# Patient Record
Sex: Male | Born: 1985 | Race: White | Hispanic: No | Marital: Married | Smoking: Current every day smoker
Health system: Southern US, Community
[De-identification: ages and names within clinical notes are randomized; demographics above are authoritative.]

## PROBLEM LIST (undated history)

## (undated) DIAGNOSIS — B20 Human immunodeficiency virus [HIV] disease: Principal | ICD-10-CM

## (undated) HISTORY — DX: Human immunodeficiency virus (HIV) disease: B20

---

## 2015-04-10 ENCOUNTER — Telehealth: Payer: Self-pay

## 2015-04-10 NOTE — Telephone Encounter (Signed)
Left message on machine to call office for new intake appointment.   Laurell Josephsammy K Junah Yam ,RN

## 2015-04-14 ENCOUNTER — Ambulatory Visit (INDEPENDENT_AMBULATORY_CARE_PROVIDER_SITE_OTHER): Payer: Self-pay

## 2015-04-14 ENCOUNTER — Ambulatory Visit: Payer: Self-pay

## 2015-04-14 DIAGNOSIS — Z113 Encounter for screening for infections with a predominantly sexual mode of transmission: Secondary | ICD-10-CM

## 2015-04-14 DIAGNOSIS — Z21 Asymptomatic human immunodeficiency virus [HIV] infection status: Secondary | ICD-10-CM

## 2015-04-14 DIAGNOSIS — Z79899 Other long term (current) drug therapy: Secondary | ICD-10-CM

## 2015-04-14 DIAGNOSIS — B20 Human immunodeficiency virus [HIV] disease: Secondary | ICD-10-CM

## 2015-04-15 LAB — HEPATITIS C ANTIBODY: HCV Ab: NEGATIVE

## 2015-04-15 LAB — COMPLETE METABOLIC PANEL WITH GFR
ALBUMIN: 4 g/dL (ref 3.6–5.1)
ALK PHOS: 64 U/L (ref 40–115)
ALT: 13 U/L (ref 9–46)
AST: 18 U/L (ref 10–40)
BUN: 9 mg/dL (ref 7–25)
CALCIUM: 10.4 mg/dL — AB (ref 8.6–10.3)
CO2: 26 mmol/L (ref 20–31)
Chloride: 103 mmol/L (ref 98–110)
Creat: 0.79 mg/dL (ref 0.60–1.35)
Glucose, Bld: 82 mg/dL (ref 65–99)
POTASSIUM: 4.3 mmol/L (ref 3.5–5.3)
Sodium: 139 mmol/L (ref 135–146)
Total Bilirubin: 0.3 mg/dL (ref 0.2–1.2)
Total Protein: 8.1 g/dL (ref 6.1–8.1)

## 2015-04-15 LAB — HEPATITIS A ANTIBODY, TOTAL: Hep A Total Ab: NONREACTIVE

## 2015-04-15 LAB — LIPID PANEL
Cholesterol: 94 mg/dL — ABNORMAL LOW (ref 125–200)
HDL: 30 mg/dL — ABNORMAL LOW (ref >=40)
LDL CALC: 56 mg/dL (ref <130)
Total CHOL/HDL Ratio: 3.1 Ratio (ref <=5.0)
Triglycerides: 40 mg/dL (ref <150)
VLDL: 8 mg/dL (ref <30)

## 2015-04-15 LAB — RPR

## 2015-04-15 LAB — HEPATITIS B SURFACE ANTIGEN: Hepatitis B Surface Ag: NEGATIVE

## 2015-04-15 LAB — HEPATITIS B CORE ANTIBODY, TOTAL: HEP B C TOTAL AB: NONREACTIVE

## 2015-04-15 LAB — HEPATITIS B SURFACE ANTIBODY,QUALITATIVE: HEP B S AB: NEGATIVE

## 2015-04-15 NOTE — Progress Notes (Signed)
Maya received partner testing at [facility] earlier in the month and now has a confirmed HIV test.   He has a same sex male partner for two years who is HIV positive.  They have not been using condoms and Alveda ReasonsZackery was never on PrEP.  Alveda ReasonsZackery says he completed an in home HIV test 6 months ago and it was negative. For the last two months he has complaint of swollen neck glands and no other symptoms. .   I explained to patient the importance of using condoms especially with his new HIV diagnosis.     Hanz almost lost consciousness  with blood draw and the labs were not performed.  He was asked to eat lunch and return at  1:30 pm the same day.  He did not return.  I called and left message on his voicemail to return for labs as soon as possible.   Laurell Josephsammy K King, RN

## 2015-05-12 ENCOUNTER — Encounter: Payer: Self-pay | Admitting: Internal Medicine

## 2015-05-20 ENCOUNTER — Ambulatory Visit: Payer: Self-pay | Admitting: Internal Medicine

## 2015-05-20 ENCOUNTER — Other Ambulatory Visit (INDEPENDENT_AMBULATORY_CARE_PROVIDER_SITE_OTHER): Payer: Self-pay

## 2015-05-20 DIAGNOSIS — Z21 Asymptomatic human immunodeficiency virus [HIV] infection status: Secondary | ICD-10-CM

## 2015-05-20 DIAGNOSIS — B20 Human immunodeficiency virus [HIV] disease: Secondary | ICD-10-CM

## 2015-05-20 LAB — CBC WITH DIFFERENTIAL/PLATELET
BASOS PCT: 0 %
Basophils Absolute: 0 cells/uL (ref 0–200)
EOS PCT: 4 %
Eosinophils Absolute: 180 cells/uL (ref 15–500)
HEMATOCRIT: 39.9 % (ref 38.5–50.0)
HEMOGLOBIN: 13.6 g/dL (ref 13.2–17.1)
LYMPHS ABS: 2025 {cells}/uL (ref 850–3900)
Lymphocytes Relative: 45 %
MCH: 28.5 pg (ref 27.0–33.0)
MCHC: 34.1 g/dL (ref 32.0–36.0)
MCV: 83.5 fL (ref 80.0–100.0)
MONO ABS: 450 {cells}/uL (ref 200–950)
MPV: 10 fL (ref 7.5–12.5)
Monocytes Relative: 10 %
NEUTROS ABS: 1845 {cells}/uL (ref 1500–7800)
NEUTROS PCT: 41 %
Platelets: 173 10*3/uL (ref 140–400)
RBC: 4.78 MIL/uL (ref 4.20–5.80)
RDW: 14.2 % (ref 11.0–15.0)
WBC: 4.5 10*3/uL (ref 3.8–10.8)

## 2015-05-21 LAB — URINALYSIS
Bilirubin Urine: NEGATIVE
Glucose, UA: NEGATIVE
HGB URINE DIPSTICK: NEGATIVE
KETONES UR: NEGATIVE
Leukocytes, UA: NEGATIVE
NITRITE: NEGATIVE
PH: 6 (ref 5.0–8.0)
Protein, ur: NEGATIVE
SPECIFIC GRAVITY, URINE: 1.027 (ref 1.001–1.035)

## 2015-05-21 LAB — T-HELPER CELL (CD4) - (RCID CLINIC ONLY)
CD4 % Helper T Cell: 14 % — ABNORMAL LOW (ref 33–55)
CD4 T Cell Abs: 280 /uL — ABNORMAL LOW (ref 400–2700)

## 2015-05-21 LAB — URINE CYTOLOGY ANCILLARY ONLY
CHLAMYDIA, DNA PROBE: NEGATIVE
NEISSERIA GONORRHEA: NEGATIVE

## 2015-05-22 LAB — QUANTIFERON TB GOLD ASSAY (BLOOD)
INTERFERON GAMMA RELEASE ASSAY: NEGATIVE
QUANTIFERON TB AG MINUS NIL: 0.03 [IU]/mL
Quantiferon Nil Value: 0.04 IU/mL

## 2015-05-22 LAB — HIV-1 RNA ULTRAQUANT REFLEX TO GENTYP+
HIV 1 RNA Quant: 99442 copies/mL — ABNORMAL HIGH (ref <20)
HIV-1 RNA Quant, Log: 5 Log copies/mL — ABNORMAL HIGH (ref <1.30)

## 2015-05-22 LAB — HLA B*5701

## 2015-05-31 LAB — HIV-1 GENOTYPR PLUS

## 2015-06-09 ENCOUNTER — Encounter: Payer: Self-pay | Admitting: Infectious Disease

## 2015-06-09 ENCOUNTER — Ambulatory Visit (INDEPENDENT_AMBULATORY_CARE_PROVIDER_SITE_OTHER): Payer: Self-pay | Admitting: Infectious Disease

## 2015-06-09 ENCOUNTER — Ambulatory Visit: Payer: Self-pay | Admitting: *Deleted

## 2015-06-09 VITALS — BP 139/74 | HR 72 | Temp 97.5°F | Ht 71.0 in | Wt 135.0 lb

## 2015-06-09 DIAGNOSIS — Z23 Encounter for immunization: Secondary | ICD-10-CM

## 2015-06-09 DIAGNOSIS — B20 Human immunodeficiency virus [HIV] disease: Secondary | ICD-10-CM

## 2015-06-09 DIAGNOSIS — F411 Generalized anxiety disorder: Secondary | ICD-10-CM

## 2015-06-09 HISTORY — DX: Human immunodeficiency virus (HIV) disease: B20

## 2015-06-09 MED ORDER — EMTRICITABINE-TENOFOVIR AF 200-25 MG PO TABS: 1.0000 | ORAL_TABLET | Freq: Every day | ORAL | Status: DC

## 2015-06-09 MED ORDER — DOLUTEGRAVIR SODIUM 50 MG PO TABS: 50.0000 mg | ORAL_TABLET | Freq: Every day | ORAL | Status: DC

## 2015-06-09 NOTE — BH Specialist Note (Signed)
Counselor met with Eddie Bruce today in the exam room for a warm hand off.  Patient is new and just diagnosed with HIV.  Counselor made the necessary introductions.  Patient was oriented times four with flat affect but proper dress.  Patient was not very engaging and had a wall up while talking to counselor.  Patient indicated that he does not need any services right now as his partner was positive and he knew pretty much hope things were going to go from here on out. Counselor provided support and encouraged patient to make an appointment if he ever needed to talk or was having a difficult time in his life.   Rolena Infante, MA, LPC [facility]

## 2015-06-09 NOTE — Progress Notes (Signed)
Subjective:    Chief complaint: establish care as new patient with HIV   Patient ID: Eddie Bruce, male    DOB: 09-25-1985, 30 y.o.   MRN: [id number]  HPI  30 year old 24 man who is recently diagnosed with HIV infection. He had symptoms consistent with acute HIV with cervical lymphadenopathy malaise and fevers. His HIV viral load and testing here was 99,000 and CD4 count close to 300. And genotype showed wild type virus he does not have hepatitis B. HLA B5701 was not performed. Apparently he nearly passed out during the phlebotomy.  He feels he is coping fairly well with his diagnosis but is anxious start therapy ADAP will be initiated today and we'll also engage in [facility] tomorrow when one of our counselors who can perform [employer] is here.  After extensive discussion we discussed decided to start him on Tivicay and Descovy  Past Medical History  Diagnosis Date  . HIV disease ([place]) 06/09/2015    History reviewed. No pertinent past surgical history.  History reviewed. No pertinent family history.    Social History   Social History  . Marital Status: Single    Spouse Name: N/A  . Number of Children: N/A  . Years of Education: N/A   Social History Main Topics  . Smoking status: Current Every Day Smoker -- 0.25 packs/day for 15 years    Types: Cigarettes  . Smokeless tobacco: Never Used  . Alcohol Use: 0.0 oz/week    0 Standard drinks or equivalent per week     Comment: rarely  . Drug Use: No  . Sexual Activity:    Partners: Male    Patent examiner Protection: None     Comment: pt. declined condoms   Other Topics Concern  . None   Social History Narrative    No Known Allergies   Current outpatient prescriptions:  .  dolutegravir (TIVICAY) 50 MG tablet, Take 1 tablet (50 mg total) by mouth daily., Disp: 30 tablet, Rfl: 11 .  emtricitabine-tenofovir AF (DESCOVY) 200-25 MG tablet, Take 1 tablet by mouth daily., Disp: 30 tablet, Rfl:  11   Review of Systems  Constitutional: Negative for fever, chills, diaphoresis, activity change, appetite change, fatigue and unexpected weight change.  HENT: Negative for congestion, rhinorrhea, sinus pressure, sneezing, sore throat and trouble swallowing.   Eyes: Negative for photophobia and visual disturbance.  Respiratory: Negative for cough, chest tightness, shortness of breath, wheezing and stridor.   Cardiovascular: Negative for chest pain, palpitations and leg swelling.  Gastrointestinal: Negative for nausea, vomiting, abdominal pain, diarrhea, constipation, blood in stool, abdominal distention and anal bleeding.  Genitourinary: Negative for dysuria, hematuria, flank pain and difficulty urinating.  Musculoskeletal: Negative for myalgias, back pain, joint swelling, arthralgias and gait problem.  Skin: Negative for color change, pallor, rash and wound.  Neurological: Negative for dizziness, tremors, weakness and light-headedness.  Hematological: Negative for adenopathy. Does not bruise/bleed easily.  Psychiatric/Behavioral: Negative for behavioral problems, confusion, sleep disturbance, dysphoric mood, decreased concentration and agitation.       Objective:   Physical Exam  Constitutional: He is oriented to person, place, and time. He appears well-developed and well-nourished.  HENT:  Head: Normocephalic and atraumatic.  Eyes: Conjunctivae and EOM are normal.  Neck: Normal range of motion. Neck supple.  Cardiovascular: Normal rate and regular rhythm.   Pulmonary/Chest: Effort normal. No respiratory distress. He has no wheezes.  Abdominal: Soft. He exhibits no distension.  Musculoskeletal: Normal range of motion. He exhibits  no edema or tenderness.  Neurological: He is alert and oriented to person, place, and time.  Skin: Skin is warm and dry. No rash noted. No erythema. No pallor.  Psychiatric: He has a normal mood and affect. His behavior is normal. Judgment and thought  content normal.          Assessment & Plan:   HIV disease: enroll into [employer], ADAP start [place] and [place] rtc to see Pharmacy in 2 weeks and to get labs 4 weeks after starting meds and see me in 5 weeks post starting meds  Smoker: needs to quit and will counsel  I spent greater than 45  minutes with the patient including greater than 50% of time in face to face counsel of the patient Regarding the nature of HIV infection disease the nature of different antiretroviral regimens in the strengths and weaknesses of each of them and in coordination of his care.

## 2015-06-12 ENCOUNTER — Ambulatory Visit: Payer: Self-pay

## 2015-06-30 ENCOUNTER — Ambulatory Visit (INDEPENDENT_AMBULATORY_CARE_PROVIDER_SITE_OTHER): Payer: Self-pay | Admitting: Pharmacist

## 2015-06-30 DIAGNOSIS — B20 Human immunodeficiency virus [HIV] disease: Secondary | ICD-10-CM

## 2015-06-30 NOTE — Progress Notes (Signed)
HPI: Eddie EwingsZackery Bruce is a 30 y.o. male presenting for HIV management and followup. Started on Tivicay and Descovy 1 week ago but states is taking the medications in the AM but is having insomnia. He is not having any problems getting to sleep but is having difficulty staying asleep. Some days he wakes up around every hour but other times he wakes up 2-3x/night.   Denies missed doses. States he takes his medications first thing in the AM. Denies taking any OTC medications  Allergies: No Known Allergies  Vitals:    Past Medical History: Past Medical History  Diagnosis Date  . HIV disease ([facility]) 06/09/2015    Social History: Social History   Social History  . Marital Status: Single    Spouse Name: N/A  . Number of Children: N/A  . Years of Education: N/A   Social History Main Topics  . Smoking status: Current Every Day Smoker -- 0.25 packs/day for 15 years    Types: Cigarettes  . Smokeless tobacco: Never Used  . Alcohol Use: 0.0 oz/week    0 Standard drinks or equivalent per week     Comment: rarely  . Drug Use: No  . Sexual Activity:    Partners: Male    Pharmacist, hospitalBirth Control/ Protection: None     Comment: pt. declined condoms   Other Topics Concern  . Not on file   Social History Narrative    Previous Regimen: N/A  Current Regimen: Tivicay 50 mg daily Descovy 200-25 mg daily   Labs: HIV 1 RNA QUANT (copies/mL)  Date Value  05/20/2015 99442*   CD4 T CELL ABS (/uL)  Date Value  05/20/2015 280*   HEP B S AB (no units)  Date Value  04/14/2015 NEG   HEPATITIS B SURFACE AG (no units)  Date Value  04/14/2015 NEGATIVE   HCV AB (no units)  Date Value  04/14/2015 NEGATIVE    CrCl: CrCl cannot be calculated (Unknown ideal weight.).  Lipids:    Component Value Date/Time   CHOL 94* 04/14/2015 1622   TRIG 40 04/14/2015 1622   HDL 30* 04/14/2015 1622   CHOLHDL 3.1 04/14/2015 1622   VLDL 8 04/14/2015 1622   LDLCALC 56 04/14/2015 1622    Assessment: HIV:  Started on Tivicay/Descovy ~1 week ago and experiencing difficulty staying asleep since starting his ART. Not known side effects for either of these medications and likely due to other stressors from new HIV diagnosis and newly started HIV management.   Recommendations: Continue Tivicay and Descovy daily  Will try benadryl or Tylenol PM PRN for insomnia Instructed that if the insomnia is a side effect that it is likely to resolved after another week or 2 since recently started the medication. Followup HIV RNA, CD4, CBC, [id number]  Followup with Dr Daiva EvesVan Dam in July   Synetta FailKelsy E Azure Budnick, PharmD Pharmacy Resident [facility] 06/30/2015, 10:10 AM

## 2015-07-14 ENCOUNTER — Other Ambulatory Visit (INDEPENDENT_AMBULATORY_CARE_PROVIDER_SITE_OTHER): Payer: Self-pay

## 2015-07-14 ENCOUNTER — Ambulatory Visit: Payer: Self-pay

## 2015-07-14 DIAGNOSIS — B20 Human immunodeficiency virus [HIV] disease: Secondary | ICD-10-CM

## 2015-07-14 LAB — COMPLETE METABOLIC PANEL WITH GFR
ALT: 10 U/L (ref 9–46)
AST: 16 U/L (ref 10–40)
Albumin: 4.3 g/dL (ref 3.6–5.1)
Alkaline Phosphatase: 71 U/L (ref 40–115)
BUN: 15 mg/dL (ref 7–25)
CALCIUM: 10.4 mg/dL — AB (ref 8.6–10.3)
CHLORIDE: 102 mmol/L (ref 98–110)
CO2: 26 mmol/L (ref 20–31)
CREATININE: 1.15 mg/dL (ref 0.60–1.35)
GFR, Est Non African American: 85 mL/min (ref >=60)
Glucose, Bld: 76 mg/dL (ref 65–99)
POTASSIUM: 3.9 mmol/L (ref 3.5–5.3)
Sodium: 136 mmol/L (ref 135–146)
Total Bilirubin: 0.4 mg/dL (ref 0.2–1.2)
Total Protein: 8.4 g/dL — ABNORMAL HIGH (ref 6.1–8.1)

## 2015-07-14 LAB — CBC WITH DIFFERENTIAL/PLATELET
Basophils Absolute: 0 cells/uL (ref 0–200)
Basophils Relative: 0 %
EOS ABS: 153 {cells}/uL (ref 15–500)
Eosinophils Relative: 3 %
HEMATOCRIT: 42.2 % (ref 38.5–50.0)
Hemoglobin: 14.2 g/dL (ref 13.2–17.1)
LYMPHS PCT: 44 %
Lymphs Abs: 2244 cells/uL (ref 850–3900)
MCH: 28.3 pg (ref 27.0–33.0)
MCHC: 33.6 g/dL (ref 32.0–36.0)
MCV: 84.1 fL (ref 80.0–100.0)
MONO ABS: 357 {cells}/uL (ref 200–950)
MPV: 9.9 fL (ref 7.5–12.5)
Monocytes Relative: 7 %
NEUTROS PCT: 46 %
Neutro Abs: 2346 cells/uL (ref 1500–7800)
Platelets: 214 10*3/uL (ref 140–400)
RBC: 5.02 MIL/uL (ref 4.20–5.80)
RDW: 14.9 % (ref 11.0–15.0)
WBC: 5.1 10*3/uL (ref 3.8–10.8)

## 2015-07-15 LAB — HIV-1 RNA QUANT-NO REFLEX-BLD
HIV 1 RNA QUANT: 257 {copies}/mL — AB (ref <20)
HIV-1 RNA Quant, Log: 2.41 Log copies/mL — ABNORMAL HIGH (ref <1.30)

## 2015-07-15 LAB — T-HELPER CELL (CD4) - (RCID CLINIC ONLY)
CD4 % Helper T Cell: 16 % — ABNORMAL LOW (ref 33–55)
CD4 T Cell Abs: 360 /uL — ABNORMAL LOW (ref 400–2700)

## 2015-07-18 LAB — HLA B*5701: HLA-B 5701 W/RFLX HLA-B HIGH: NEGATIVE

## 2015-07-28 ENCOUNTER — Encounter: Payer: Self-pay | Admitting: Infectious Disease

## 2015-07-28 ENCOUNTER — Ambulatory Visit (INDEPENDENT_AMBULATORY_CARE_PROVIDER_SITE_OTHER): Payer: Self-pay | Admitting: Infectious Disease

## 2015-07-28 VITALS — BP 136/72 | HR 90 | Temp 97.3°F | Ht 71.0 in | Wt 139.5 lb

## 2015-07-28 DIAGNOSIS — B20 Human immunodeficiency virus [HIV] disease: Secondary | ICD-10-CM

## 2015-07-28 DIAGNOSIS — Z23 Encounter for immunization: Secondary | ICD-10-CM

## 2015-07-28 NOTE — Progress Notes (Signed)
Subjective:   Chief complaint: followup for recently diagnosed HIV infection    Patient ID: Eddie Bruce, male    DOB: 1985-05-21, 30 y.o.   MRN: [id number]  HPI   30 year old 19 man who is recently diagnosed with HIV infection. He had symptoms consistent with acute HIV with cervical lymphadenopathy malaise and fevers. His HIV viral load and testing here was 99,000 and CD4 count close to 300. And genotype showed wild type virus he does not have hepatitis B. HLA B5701 was not performed.   We enrolled him into [place] but he left town and could not start his ARV until late June and meds were Tivicay and Descovy. VL has come down from 99k to above 200 copies and he claims to be highly adherent to meds.   Past Medical History:  Diagnosis Date  . HIV disease ([place]) 06/09/2015    No past surgical history on file.  No family history on file.    Social History   Social History  . Marital status: Single    Spouse name: N/A  . Number of children: N/A  . Years of education: N/A   Social History Main Topics  . Smoking status: Current Every Day Smoker    Packs/day: 0.25    Years: 15.00    Types: Cigarettes  . Smokeless tobacco: Never Used  . Alcohol use 0.0 oz/week     Comment: rarely  . Drug use: No  . Sexual activity: Yes    Partners: Male    Birth control/ protection: 30     Comment: pt. declined condoms   Other Topics Concern  . None   Social History Narrative  . None    No Known Allergies   Current Outpatient Prescriptions:  .  dolutegravir (TIVICAY) 50 MG tablet, Take 1 tablet (50 mg total) by mouth daily., Disp: 30 tablet, Rfl: 11 .  emtricitabine-tenofovir AF (DESCOVY) 200-25 MG tablet, Take 1 tablet by mouth daily., Disp: 30 tablet, Rfl: 11   Review of Systems  Constitutional: Negative for activity change, appetite change, chills, diaphoresis, fatigue, fever and unexpected weight change.  HENT: Negative for congestion, rhinorrhea,  sinus pressure, sneezing, sore throat and trouble swallowing.   Eyes: Negative for photophobia and visual disturbance.  Respiratory: Negative for cough, chest tightness, shortness of breath, wheezing and stridor.   Cardiovascular: Negative for chest pain, palpitations and leg swelling.  Gastrointestinal: Negative for abdominal distention, abdominal pain, anal bleeding, blood in stool, constipation, diarrhea, nausea and vomiting.  Genitourinary: Negative for difficulty urinating, dysuria, flank pain and hematuria.  Musculoskeletal: Negative for arthralgias, back pain, gait problem, joint swelling and myalgias.  Skin: Negative for color change, pallor, rash and wound.  Neurological: Negative for dizziness, tremors, weakness and light-headedness.  Hematological: Negative for adenopathy. Does not bruise/bleed easily.  Psychiatric/Behavioral: Negative for agitation, behavioral problems, confusion, decreased concentration, dysphoric mood and sleep disturbance.       Objective:   Physical Exam  Constitutional: He is oriented to person, place, and time. He appears well-developed and well-nourished.  HENT:  Head: Normocephalic and atraumatic.  Eyes: Conjunctivae and EOM are normal.  Neck: Normal range of motion. Neck supple.  Cardiovascular: Normal rate and regular rhythm.   Pulmonary/Chest: Effort normal. No respiratory distress. He has no wheezes.  Abdominal: Soft. He exhibits no distension.  Musculoskeletal: Normal range of motion. He exhibits no edema or tenderness.  Neurological: He is alert and oriented to person, place, and time.  Skin: Skin  is warm and dry. No rash noted. No erythema. No pallor.  Psychiatric: He has a normal mood and affect. His behavior is normal. Judgment and thought content normal.          Assessment & Plan:   HIV disease: renew ADAP and recheck VL today and continue Tivicay and Descovy rtc to see me in 3 months  Smoker: needs to quit and will counsel

## 2015-08-01 LAB — HIV RNA, RTPCR W/R GT (RTI, PI,INT)
HIV 1 RNA Quant: 72 copies/mL — ABNORMAL HIGH
HIV-1 RNA QUANT, LOG: 1.86 {Log_copies}/mL — AB

## 2015-08-06 ENCOUNTER — Encounter: Payer: Self-pay | Admitting: Infectious Disease

## 2015-08-13 ENCOUNTER — Other Ambulatory Visit: Payer: Self-pay | Admitting: *Deleted

## 2015-08-13 MED ORDER — EMTRICITABINE-TENOFOVIR AF 200-25 MG PO TABS: 1.0000 | ORAL_TABLET | Freq: Every day | ORAL | 5 refills | Status: DC

## 2015-08-13 MED ORDER — DOLUTEGRAVIR SODIUM 50 MG PO TABS: 50.0000 mg | ORAL_TABLET | Freq: Every day | ORAL | 5 refills | Status: DC

## 2015-10-27 ENCOUNTER — Ambulatory Visit: Payer: Self-pay | Admitting: Infectious Disease

## 2015-11-04 ENCOUNTER — Ambulatory Visit: Payer: Self-pay | Admitting: Infectious Disease

## 2016-01-07 ENCOUNTER — Other Ambulatory Visit (HOSPITAL_COMMUNITY)
Admission: RE | Admit: 2016-01-07 | Discharge: 2016-01-07 | Disposition: A | Discharge status: Home / Self Care | Payer: BLUE CROSS/BLUE SHIELD | Source: Ambulatory Visit | Attending: Infectious Disease | Admitting: Infectious Disease

## 2016-01-07 ENCOUNTER — Ambulatory Visit (INDEPENDENT_AMBULATORY_CARE_PROVIDER_SITE_OTHER): Payer: Self-pay | Admitting: Infectious Disease

## 2016-01-07 ENCOUNTER — Encounter: Payer: Self-pay | Admitting: Infectious Disease

## 2016-01-07 VITALS — BP 158/78 | HR 94 | Temp 98.0°F | Ht 71.0 in | Wt 145.0 lb

## 2016-01-07 DIAGNOSIS — Z23 Encounter for immunization: Secondary | ICD-10-CM

## 2016-01-07 DIAGNOSIS — Z113 Encounter for screening for infections with a predominantly sexual mode of transmission: Secondary | ICD-10-CM | POA: Diagnosis present

## 2016-01-07 DIAGNOSIS — B20 Human immunodeficiency virus [HIV] disease: Secondary | ICD-10-CM

## 2016-01-07 LAB — CBC WITH DIFFERENTIAL/PLATELET
BASOS ABS: 0 {cells}/uL (ref 0–200)
BASOS PCT: 0 %
EOS ABS: 64 {cells}/uL (ref 15–500)
Eosinophils Relative: 1 %
HCT: 41.6 % (ref 38.5–50.0)
HEMOGLOBIN: 14.1 g/dL (ref 13.2–17.1)
LYMPHS ABS: 2560 {cells}/uL (ref 850–3900)
Lymphocytes Relative: 40 %
MCH: 30.5 pg (ref 27.0–33.0)
MCHC: 33.9 g/dL (ref 32.0–36.0)
MCV: 90 fL (ref 80.0–100.0)
MONO ABS: 448 {cells}/uL (ref 200–950)
MONOS PCT: 7 %
MPV: 9.9 fL (ref 7.5–12.5)
NEUTROS ABS: 3328 {cells}/uL (ref 1500–7800)
Neutrophils Relative %: 52 %
PLATELETS: 258 10*3/uL (ref 140–400)
RBC: 4.62 MIL/uL (ref 4.20–5.80)
RDW: 13.9 % (ref 11.0–15.0)
WBC: 6.4 10*3/uL (ref 3.8–10.8)

## 2016-01-07 NOTE — Progress Notes (Signed)
Subjective:   Chief complaint: followup for recently diagnosed HIV infection    Patient ID: Eddie Bruce, male    DOB: 1985/02/08, 31 y.o.   MRN: [id number]  HPI  31 year old Caucasian man who is recently diagnosed with HIV infection.   We enrolled him into [employer] and ADAP but he left town and could not start his ARV until late June and meds were Tivicay and Descovy With nice virological suppression. Have not seen him since July but his ADAP has remained active and he is renewing it today. He has had sex only with a monogamous partner. He is due for hep B and a shots as well as flu shot and he also needs vaccination for meningococcus.  Past Medical History:  Diagnosis Date  . HIV disease ([facility]) 06/09/2015    No past surgical history on file.  No family history on file.    Social History   Social History  . Marital status: Single    Spouse name: N/A  . Number of children: N/A  . Years of education: N/A   Social History Main Topics  . Smoking status: Current Every Day Smoker    Packs/day: 0.25    Years: 15.00    Types: Cigarettes  . Smokeless tobacco: Never Used  . Alcohol use 0.0 oz/week     Comment: rarely  . Drug use: No  . Sexual activity: Yes    Partners: Male    Birth control/ protection: None     Comment: pt. declined condoms   Other Topics Concern  . Not on file   Social History Narrative  . No narrative on file    No Known Allergies   Current Outpatient Prescriptions:  .  dolutegravir (TIVICAY) 50 MG tablet, Take 1 tablet (50 mg total) by mouth daily., Disp: 30 tablet, Rfl: 5 .  emtricitabine-tenofovir AF (DESCOVY) 200-25 MG tablet, Take 1 tablet by mouth daily., Disp: 30 tablet, Rfl: 5   Review of Systems  Constitutional: Negative for activity change, appetite change, chills, diaphoresis, fatigue, fever and unexpected weight change.  HENT: Negative for congestion, rhinorrhea, sinus pressure, sneezing, sore throat and trouble swallowing.     Eyes: Negative for photophobia and visual disturbance.  Respiratory: Negative for cough, chest tightness, shortness of breath, wheezing and stridor.   Cardiovascular: Negative for chest pain, palpitations and leg swelling.  Gastrointestinal: Negative for abdominal distention, abdominal pain, anal bleeding, blood in stool, constipation, diarrhea, nausea and vomiting.  Genitourinary: Negative for difficulty urinating, dysuria, flank pain and hematuria.  Musculoskeletal: Negative for arthralgias, back pain, gait problem, joint swelling and myalgias.  Skin: Negative for color change, pallor, rash and wound.  Neurological: Negative for dizziness, tremors, weakness and light-headedness.  Hematological: Negative for adenopathy. Does not bruise/bleed easily.  Psychiatric/Behavioral: Negative for agitation, behavioral problems, confusion, decreased concentration, dysphoric mood and sleep disturbance.       Objective:   Physical Exam  Constitutional: He is oriented to person, place, and time. He appears well-developed and well-nourished.  HENT:  Head: Normocephalic and atraumatic.  Eyes: Conjunctivae and EOM are normal.  Neck: Normal range of motion. Neck supple.  Cardiovascular: Normal rate and regular rhythm.   Pulmonary/Chest: Effort normal. No respiratory distress. He has no wheezes.  Abdominal: Soft. He exhibits no distension.  Musculoskeletal: Normal range of motion. He exhibits no edema or tenderness.  Neurological: He is alert and oriented to person, place, and time.  Skin: Skin is warm and dry. No rash noted. No  erythema. No pallor.  Psychiatric: He has a normal mood and affect. His behavior is normal. Judgment and thought content normal.          Assessment & Plan:   HIV disease: renew ADAP and recheck VL today and continue Tivicay and Descovy rtc to see me in 2 months   Need for vaccines: see above

## 2016-01-07 NOTE — Addendum Note (Signed)
Addended by: Andree CossHOWELL, Antino Mayabb M on: 01/07/2016 05:25 PM   Modules accepted: Orders

## 2016-01-08 LAB — COMPLETE METABOLIC PANEL WITH GFR
ALT: 11 U/L (ref 9–46)
AST: 14 U/L (ref 10–40)
Albumin: 4.5 g/dL (ref 3.6–5.1)
Alkaline Phosphatase: 74 U/L (ref 40–115)
BILIRUBIN TOTAL: 0.3 mg/dL (ref 0.2–1.2)
BUN: 16 mg/dL (ref 7–25)
CHLORIDE: 107 mmol/L (ref 98–110)
CO2: 23 mmol/L (ref 20–31)
Calcium: 10.3 mg/dL (ref 8.6–10.3)
Creat: 1.04 mg/dL (ref 0.60–1.35)
GLUCOSE: 111 mg/dL — AB (ref 65–99)
POTASSIUM: 3.8 mmol/L (ref 3.5–5.3)
SODIUM: 141 mmol/L (ref 135–146)
Total Protein: 7.7 g/dL (ref 6.1–8.1)

## 2016-01-08 LAB — RPR

## 2016-01-09 LAB — URINE CYTOLOGY ANCILLARY ONLY
CHLAMYDIA, DNA PROBE: NEGATIVE
NEISSERIA GONORRHEA: NEGATIVE

## 2016-01-09 LAB — T-HELPER CELL (CD4) - (RCID CLINIC ONLY)
CD4 % Helper T Cell: 23 % — ABNORMAL LOW (ref 33–55)
CD4 T CELL ABS: 590 /uL (ref 400–2700)

## 2016-01-10 LAB — HIV RNA, RTPCR W/R GT (RTI, PI,INT)

## 2016-02-08 ENCOUNTER — Other Ambulatory Visit: Payer: Self-pay | Admitting: Infectious Disease

## 2016-02-24 ENCOUNTER — Telehealth: Payer: Self-pay | Admitting: *Deleted

## 2016-02-24 NOTE — Telephone Encounter (Signed)
Patient's partner asked for assistance, reporting patient has been without medication for approximately 1 month due to financial confusion at his pharmacy. Patient now with [employer], applied for ICAP.  [facility]'s rx [facility] [place] to [facility] [place].  Pharmacy updated. [facility], member [id number]  RN contacted pharmacy.  After requesting more information, they were able to make the prescriptions go through secondary coverage (ICAP).  Patient will have medications tomorrow. RN notified patient's partner.   Andree CossHowell, Kaelem Brach M, RN

## 2016-03-09 ENCOUNTER — Encounter: Payer: Self-pay | Admitting: Infectious Disease

## 2016-03-15 ENCOUNTER — Ambulatory Visit: Payer: Self-pay | Admitting: Infectious Disease

## 2016-04-29 ENCOUNTER — Encounter: Payer: Self-pay | Admitting: Infectious Disease

## 2016-04-29 ENCOUNTER — Ambulatory Visit (INDEPENDENT_AMBULATORY_CARE_PROVIDER_SITE_OTHER): Payer: BLUE CROSS/BLUE SHIELD | Admitting: Infectious Disease

## 2016-04-29 ENCOUNTER — Other Ambulatory Visit (HOSPITAL_COMMUNITY)
Admission: RE | Admit: 2016-04-29 | Discharge: 2016-04-29 | Disposition: A | Discharge status: Home / Self Care | Payer: BLUE CROSS/BLUE SHIELD | Source: Ambulatory Visit | Attending: Infectious Disease | Admitting: Infectious Disease

## 2016-04-29 VITALS — BP 169/89 | HR 112 | Temp 98.7°F | Wt 148.0 lb

## 2016-04-29 DIAGNOSIS — B20 Human immunodeficiency virus [HIV] disease: Secondary | ICD-10-CM | POA: Insufficient documentation

## 2016-04-29 LAB — COMPLETE METABOLIC PANEL WITH GFR
AG Ratio: 1.3 Ratio (ref 1.0–2.5)
ALBUMIN: 4.4 g/dL (ref 3.6–5.1)
ALT: 11 U/L (ref 9–46)
AST: 16 U/L (ref 10–40)
Alkaline Phosphatase: 82 U/L (ref 40–115)
BILIRUBIN TOTAL: 0.4 mg/dL (ref 0.2–1.2)
BUN / CREAT RATIO: 13.2 ratio (ref 6–22)
BUN: 15 mg/dL (ref 7–25)
CALCIUM: 10.2 mg/dL (ref 8.6–10.3)
CHLORIDE: 103 mmol/L (ref 98–110)
CO2: 22 mmol/L (ref 20–31)
CREATININE: 1.14 mg/dL (ref 0.60–1.35)
GFR, Est African American: >89 mL/min (ref >=60)
GFR, Est Non African American: 85 mL/min (ref >=60)
GLOBULIN: 3.5 g/dL (ref 1.9–3.7)
Glucose, Bld: 89 mg/dL (ref 65–99)
Potassium: 3.9 mmol/L (ref 3.5–5.3)
Sodium: 135 mmol/L (ref 135–146)
Total Protein: 7.9 g/dL (ref 6.1–8.1)

## 2016-04-29 LAB — CBC WITH DIFFERENTIAL/PLATELET
BASOS ABS: 0 {cells}/uL (ref 0–200)
Basophils Relative: 0 %
EOS ABS: 75 {cells}/uL (ref 15–500)
Eosinophils Relative: 1 %
HEMATOCRIT: 41.7 % (ref 38.5–50.0)
Hemoglobin: 14.1 g/dL (ref 13.2–17.1)
LYMPHS PCT: 37 %
Lymphs Abs: 2775 cells/uL (ref 850–3900)
MCH: 30.3 pg (ref 27.0–33.0)
MCHC: 33.8 g/dL (ref 32.0–36.0)
MCV: 89.7 fL (ref 80.0–100.0)
MONO ABS: 525 {cells}/uL (ref 200–950)
MPV: 9.8 fL (ref 7.5–12.5)
Monocytes Relative: 7 %
NEUTROS PCT: 55 %
Neutro Abs: 4125 cells/uL (ref 1500–7800)
Platelets: 295 10*3/uL (ref 140–400)
RBC: 4.65 MIL/uL (ref 4.20–5.80)
RDW: 13.7 % (ref 11.0–15.0)
WBC: 7.5 10*3/uL (ref 3.8–10.8)

## 2016-04-29 LAB — LIPID PANEL
Cholesterol: 135 mg/dL (ref <200)
HDL: 47 mg/dL (ref >40)
LDL CALC: 71 mg/dL (ref <100)
Total CHOL/HDL Ratio: 2.9 Ratio (ref <5.0)
Triglycerides: 87 mg/dL (ref <150)
VLDL: 17 mg/dL (ref <30)

## 2016-04-29 NOTE — Progress Notes (Signed)
Subjective:   Chief complaint: followup for recently diagnosed HIV infection    Patient ID: Eddie Bruce, male    DOB: September 14, 1985, 31 y.o.   MRN: [id number]  HPI  31 year old Caucasian man who is recently diagnosed with HIV infection.   We enrolled him into [employer] and ADAP but he left town and could not start his ARV until late June and meds were Tivicay and Descovy With nice virological suppression. He came to clinic with his HIV + monogamous partner who is on ARV Darrin Luis) and seeing one of my partners for his care.  He has no specific complaints today. He is enrolled into ICAP.  Lab Results  Component Value Date   HIV1RNAQUANT 72 (H) 07/28/2015   HIV1RNAQUANT 257 (H) 07/14/2015   HIV1RNAQUANT 99,442 (H) 05/20/2015   Lab Results  Component Value Date   CD4TABS 590 01/07/2016   CD4TABS 360 (L) 07/14/2015   CD4TABS 280 (L) 05/20/2015      Past Medical History:  Diagnosis Date  . HIV disease ([facility]) 06/09/2015    No past surgical history on file.  No family history on file.    Social History   Social History  . Marital status: Single    Spouse name: N/A  . Number of children: N/A  . Years of education: N/A   Social History Main Topics  . Smoking status: Current Every Day Smoker    Packs/day: 0.25    Years: 15.00    Types: Cigarettes  . Smokeless tobacco: Never Used     Comment: cutting back  . Alcohol use 0.0 oz/week     Comment: rarely  . Drug use: No  . Sexual activity: Yes    Partners: Male    Birth control/ protection: None     Comment: pt. declined condoms   Other Topics Concern  . None   Social History Narrative  . None    No Known Allergies   Current Outpatient Prescriptions:  .  DESCOVY 200-25 MG tablet, TAKE 1 TABLET BY MOUTH DAILY, Disp: 30 tablet, Rfl: 5 .  TIVICAY 50 MG tablet, TAKE 1 TABLET(50 MG) BY MOUTH DAILY, Disp: 30 tablet, Rfl: 5   Review of Systems  Constitutional: Negative for activity change, appetite change,  chills, diaphoresis, fatigue, fever and unexpected weight change.  HENT: Negative for congestion, rhinorrhea, sinus pressure, sneezing, sore throat and trouble swallowing.   Eyes: Negative for photophobia and visual disturbance.  Respiratory: Negative for cough, chest tightness, shortness of breath, wheezing and stridor.   Cardiovascular: Negative for chest pain, palpitations and leg swelling.  Gastrointestinal: Negative for abdominal distention, abdominal pain, anal bleeding, blood in stool, constipation, diarrhea, nausea and vomiting.  Genitourinary: Negative for dysuria, flank pain and hematuria.  Musculoskeletal: Negative for arthralgias, back pain, gait problem, joint swelling and myalgias.  Skin: Negative for color change, pallor, rash and wound.  Neurological: Negative for dizziness, tremors, weakness and light-headedness.  Hematological: Negative for adenopathy. Does not bruise/bleed easily.  Psychiatric/Behavioral: Negative for agitation, behavioral problems, confusion, decreased concentration, dysphoric mood and sleep disturbance.       Objective:   Physical Exam  Constitutional: He is oriented to person, place, and time. He appears well-developed and well-nourished.  HENT:  Head: Normocephalic and atraumatic.  Eyes: Conjunctivae and EOM are normal.  Neck: Normal range of motion. Neck supple.  Cardiovascular: Normal rate and regular rhythm.   Pulmonary/Chest: Effort normal. No respiratory distress. He has no wheezes.  Abdominal: Soft. He exhibits no  distension.  Musculoskeletal: Normal range of motion. He exhibits no edema or tenderness.  Neurological: He is alert and oriented to person, place, and time.  Skin: Skin is warm and dry. No rash noted. No erythema. No pallor.  Psychiatric: He has a normal mood and affect. His behavior is normal. Judgment and thought content normal.          Assessment & Plan:   HIV disease: renew ADAP and recheck VL in July and continue  Tivicay and Descovy rtc to see me in  Next 4  months   Need for vaccines: see above  I spent greater than 25 minutes with the patient including greater than 50% of time in face to face counsel of the patient re his HIV, ARV nd in coordination of his care.

## 2016-04-30 LAB — URINE CYTOLOGY ANCILLARY ONLY
Chlamydia: NEGATIVE
NEISSERIA GONORRHEA: NEGATIVE

## 2016-04-30 LAB — T-HELPER CELL (CD4) - (RCID CLINIC ONLY)
CD4 T CELL ABS: 690 /uL (ref 400–2700)
CD4 T CELL HELPER: 21 % — AB (ref 33–55)

## 2016-04-30 LAB — RPR

## 2016-04-30 NOTE — Addendum Note (Signed)
Addended by: Alesia Morin F on: 04/30/2016 01:04 PM   Modules accepted: Orders

## 2016-05-03 LAB — HIV-1 RNA QUANT-NO REFLEX-BLD
HIV 1 RNA Quant: 23 copies/mL — ABNORMAL HIGH
HIV-1 RNA QUANT, LOG: 1.36 {Log_copies}/mL — AB

## 2016-07-22 ENCOUNTER — Ambulatory Visit: Payer: BLUE CROSS/BLUE SHIELD

## 2016-07-27 ENCOUNTER — Encounter: Payer: Self-pay | Admitting: Infectious Disease

## 2016-08-06 ENCOUNTER — Other Ambulatory Visit: Payer: Self-pay | Admitting: Infectious Disease

## 2016-08-25 ENCOUNTER — Ambulatory Visit: Payer: BLUE CROSS/BLUE SHIELD | Admitting: Infectious Disease

## 2016-09-14 ENCOUNTER — Other Ambulatory Visit (HOSPITAL_COMMUNITY)
Admission: RE | Admit: 2016-09-14 | Discharge: 2016-09-14 | Disposition: A | Discharge status: Home / Self Care | Payer: BLUE CROSS/BLUE SHIELD | Source: Ambulatory Visit | Attending: Infectious Disease | Admitting: Infectious Disease

## 2016-09-14 ENCOUNTER — Encounter: Payer: Self-pay | Admitting: Infectious Disease

## 2016-09-14 ENCOUNTER — Ambulatory Visit (INDEPENDENT_AMBULATORY_CARE_PROVIDER_SITE_OTHER): Payer: BLUE CROSS/BLUE SHIELD | Admitting: Infectious Disease

## 2016-09-14 DIAGNOSIS — Z23 Encounter for immunization: Secondary | ICD-10-CM | POA: Diagnosis not present

## 2016-09-14 DIAGNOSIS — B20 Human immunodeficiency virus [HIV] disease: Secondary | ICD-10-CM

## 2016-09-14 MED ORDER — EMTRICITABINE-TENOFOVIR AF 200-25 MG PO TABS: 1.0000 | ORAL_TABLET | Freq: Every day | ORAL | 11 refills | Status: DC

## 2016-09-14 MED ORDER — DOLUTEGRAVIR SODIUM 50 MG PO TABS: 50.0000 mg | ORAL_TABLET | Freq: Every day | ORAL | 11 refills | Status: DC

## 2016-09-14 NOTE — Progress Notes (Signed)
Subjective:   Chief complaint: followup for HIV disease on meds   Patient ID: Eddie Bruce, male    DOB: 12-29-1985, 31 y.o.   MRN: [id number]  HPI  31 year old Caucasian man who is recently diagnosed with HIV infection.   We enrolled him into [employer] and [employer] but he left town and could not start his ARV until late June and meds were Tivicay and Descovy With nice virological suppression. He came to clinic with his HIV + monogamous partner who is on ARV Darrin Luis(Genvoya) and seeing one of my partners for his care.  He has no specific complaints today. He is enrolled into [facility] and this is paying for premiums on marketplace.  Lab Results  Component Value Date   HIV1RNAQUANT 23 (H) 04/29/2016   HIV1RNAQUANT 72 (H) 07/28/2015   HIV1RNAQUANT 257 (H) 07/14/2015   Lab Results  Component Value Date   CD4TABS 690 04/29/2016   CD4TABS 590 01/07/2016   CD4TABS 360 (L) 07/14/2015      Past Medical History:  Diagnosis Date  . HIV disease (HCC) 06/09/2015    No past surgical history on file.  No family history on file.    Social History   Social History  . Marital status: Single    Spouse name: N/A  . Number of children: N/A  . Years of education: N/A   Social History Main Topics  . Smoking status: Current Every Day Smoker    Packs/day: 0.25    Years: 15.00    Types: Cigarettes  . Smokeless tobacco: Never Used     Comment: cutting back  . Alcohol use 0.0 oz/week     Comment: rarely  . Drug use: No  . Sexual activity: Yes    Partners: Male    Birth control/ protection: None     Comment: pt. declined condoms   Other Topics Concern  . None   Social History Narrative  . None    No Known Allergies   Current Outpatient Prescriptions:  .  busPIRone (BUSPAR) 15 MG tablet, Take 15 mg by mouth 2 (two) times daily., Disp: , Rfl:  .  dolutegravir (TIVICAY) 50 MG tablet, Take 1 tablet (50 mg total) by mouth daily., Disp: 30 tablet, Rfl: 11 .  emtricitabine-tenofovir AF  (DESCOVY) 200-25 MG tablet, Take 1 tablet by mouth daily., Disp: 30 tablet, Rfl: 11   Review of Systems  Constitutional: Negative for activity change, appetite change, chills, diaphoresis, fatigue, fever and unexpected weight change.  HENT: Negative for congestion, rhinorrhea, sinus pressure, sneezing, sore throat and trouble swallowing.   Eyes: Negative for photophobia.  Respiratory: Negative for cough, chest tightness, shortness of breath, wheezing and stridor.   Cardiovascular: Negative for chest pain, palpitations and leg swelling.  Gastrointestinal: Negative for abdominal distention, abdominal pain, anal bleeding, blood in stool, constipation, diarrhea, nausea and vomiting.  Genitourinary: Negative for dysuria, flank pain and hematuria.  Musculoskeletal: Negative for arthralgias, back pain, gait problem, joint swelling and myalgias.  Skin: Negative for color change, pallor, rash and wound.  Neurological: Negative for dizziness, tremors, weakness and light-headedness.  Hematological: Negative for adenopathy. Does not bruise/bleed easily.  Psychiatric/Behavioral: Negative for agitation, behavioral problems, confusion, decreased concentration, dysphoric mood and sleep disturbance.       Objective:   Physical Exam  Constitutional: He is oriented to person, place, and time. He appears well-developed and well-nourished.  HENT:  Head: Normocephalic and atraumatic.  Eyes: Conjunctivae and EOM are normal.  Neck: Normal range of  motion.  Cardiovascular: Normal rate and regular rhythm.   Pulmonary/Chest: Effort normal. No respiratory distress. He has no wheezes.  Abdominal: Soft. He exhibits no distension.  Musculoskeletal: Normal range of motion. He exhibits no edema or tenderness.  Neurological: He is alert and oriented to person, place, and time.  Skin: Skin is warm and dry. No rash noted. No erythema. No pallor.  Psychiatric: He has a normal mood and affect. His behavior is normal.  Judgment and thought content normal.          Assessment & Plan:   HIV disease:  continue Tivicay and Descovy come back in January and renew program

## 2016-09-15 LAB — LIPID PANEL
CHOLESTEROL: 164 mg/dL (ref <200)
HDL: 31 mg/dL — AB (ref >40)
LDL Cholesterol (Calc): 88 mg/dL (calc)
Non-HDL Cholesterol (Calc): 133 mg/dL (calc) — ABNORMAL HIGH (ref <130)
TRIGLYCERIDES: 340 mg/dL — AB (ref <150)
Total CHOL/HDL Ratio: 5.3 (calc) — ABNORMAL HIGH (ref <5.0)

## 2016-09-15 LAB — CBC WITH DIFFERENTIAL/PLATELET
Basophils Absolute: 31 cells/uL (ref 0–200)
Basophils Relative: 0.5 %
Eosinophils Absolute: 68 cells/uL (ref 15–500)
Eosinophils Relative: 1.1 %
HCT: 42.5 % (ref 38.5–50.0)
Hemoglobin: 14.4 g/dL (ref 13.2–17.1)
Lymphs Abs: 2269 cells/uL (ref 850–3900)
MCH: 30 pg (ref 27.0–33.0)
MCHC: 33.9 g/dL (ref 32.0–36.0)
MCV: 88.5 fL (ref 80.0–100.0)
MONOS PCT: 8.7 %
MPV: 9.8 fL (ref 7.5–12.5)
NEUTROS PCT: 53.1 %
Neutro Abs: 3292 cells/uL (ref 1500–7800)
Platelets: 277 10*3/uL (ref 140–400)
RBC: 4.8 10*6/uL (ref 4.20–5.80)
RDW: 12.8 % (ref 11.0–15.0)
Total Lymphocyte: 36.6 %
WBC mixed population: 539 cells/uL (ref 200–950)
WBC: 6.2 10*3/uL (ref 3.8–10.8)

## 2016-09-15 LAB — COMPLETE METABOLIC PANEL WITH GFR
AG RATIO: 1.3 (calc) (ref 1.0–2.5)
ALKALINE PHOSPHATASE (APISO): 82 U/L (ref 40–115)
ALT: 21 U/L (ref 9–46)
AST: 21 U/L (ref 10–40)
Albumin: 4.6 g/dL (ref 3.6–5.1)
BILIRUBIN TOTAL: 0.3 mg/dL (ref 0.2–1.2)
BUN: 20 mg/dL (ref 7–25)
CALCIUM: 10.4 mg/dL — AB (ref 8.6–10.3)
CHLORIDE: 103 mmol/L (ref 98–110)
CO2: 26 mmol/L (ref 20–32)
Creat: 1.2 mg/dL (ref 0.60–1.35)
GFR, EST AFRICAN AMERICAN: 93 mL/min/{1.73_m2} (ref >=60)
GFR, Est Non African American: 80 mL/min/{1.73_m2} (ref >=60)
GLUCOSE: 85 mg/dL (ref 65–99)
Globulin: 3.5 g/dL (calc) (ref 1.9–3.7)
POTASSIUM: 3.8 mmol/L (ref 3.5–5.3)
Sodium: 137 mmol/L (ref 135–146)
Total Protein: 8.1 g/dL (ref 6.1–8.1)

## 2016-09-15 LAB — MICROALBUMIN / CREATININE URINE RATIO
Creatinine, Urine: 124 mg/dL (ref 20–320)
MICROALB/CREAT RATIO: 5 ug/mg{creat} (ref <30)
Microalb, Ur: 0.6 mg/dL

## 2016-09-15 LAB — RPR: RPR Ser Ql: NONREACTIVE

## 2016-09-16 LAB — HIV-1 RNA QUANT-NO REFLEX-BLD
HIV 1 RNA QUANT: DETECTED {copies}/mL — AB
HIV-1 RNA QUANT, LOG: DETECTED {Log_copies}/mL — AB

## 2016-09-16 LAB — T-HELPER CELL (CD4) - (RCID CLINIC ONLY)
CD4 T CELL HELPER: 25 % — AB (ref 33–55)
CD4 T Cell Abs: 590 /uL (ref 400–2700)

## 2016-09-16 LAB — URINE CYTOLOGY ANCILLARY ONLY
Chlamydia: NEGATIVE
Neisseria Gonorrhea: NEGATIVE

## 2017-01-17 ENCOUNTER — Ambulatory Visit: Payer: BLUE CROSS/BLUE SHIELD | Admitting: Infectious Disease

## 2017-02-01 ENCOUNTER — Encounter: Payer: Self-pay | Admitting: Infectious Disease

## 2017-02-01 ENCOUNTER — Ambulatory Visit (INDEPENDENT_AMBULATORY_CARE_PROVIDER_SITE_OTHER): Payer: BLUE CROSS/BLUE SHIELD | Admitting: Infectious Disease

## 2017-02-01 ENCOUNTER — Other Ambulatory Visit (HOSPITAL_COMMUNITY)
Admission: RE | Admit: 2017-02-01 | Discharge: 2017-02-01 | Disposition: A | Discharge status: Home / Self Care | Payer: BLUE CROSS/BLUE SHIELD | Source: Ambulatory Visit | Attending: Infectious Disease | Admitting: Infectious Disease

## 2017-02-01 DIAGNOSIS — B20 Human immunodeficiency virus [HIV] disease: Secondary | ICD-10-CM | POA: Diagnosis not present

## 2017-02-01 NOTE — Progress Notes (Signed)
Subjective:   Chief complaint: followup for HIV disease on meds   Patient ID: Eddie Bruce, male    DOB: 1985-10-06, 32 y.o.   MRN: [id number]  HPI  32 year old Caucasian man with perfectly controlled HIV.  He is enrolled into [facility] and this is paying for premiums on marketplace. He has renewed already.  Lab Results  Component Value Date   HIV1RNAQUANT <20 DETECTED (A) 09/14/2016   HIV1RNAQUANT 23 (H) 04/29/2016   HIV1RNAQUANT 72 (H) 07/28/2015   Lab Results  Component Value Date   CD4TABS 590 09/14/2016   CD4TABS 690 04/29/2016   CD4TABS 590 01/07/2016   He likes his Tivicay and Descovy and does not want to change meds.   Past Medical History:  Diagnosis Date  . HIV disease (HCC) 06/09/2015    No past surgical history on file.  No family history on file.    Social History   Socioeconomic History  . Marital status: Single    Spouse name: None  . Number of children: None  . Years of education: None  . Highest education level: None  Social Needs  . Financial resource strain: None  . Food insecurity - worry: None  . Food insecurity - inability: None  . Transportation needs - medical: None  . Transportation needs - non-medical: None  Occupational History  . None  Tobacco Use  . Smoking status: Current Every Day Smoker    Packs/day: 0.25    Years: 15.00    Pack years: 3.75    Types: Cigarettes  . Smokeless tobacco: Never Used  . Tobacco comment: cutting back  Substance and Sexual Activity  . Alcohol use: Yes    Alcohol/week: 0.0 oz    Comment: rarely  . Drug use: No  . Sexual activity: Yes    Partners: Male    Birth control/protection: None    Comment: pt. declined condoms  Other Topics Concern  . None  Social History Narrative  . None    No Known Allergies   Current Outpatient Medications:  .  busPIRone (BUSPAR) 15 MG tablet, Take 15 mg by mouth 2 (two) times daily., Disp: , Rfl:  .  dolutegravir (TIVICAY) 50 MG tablet, Take 1 tablet (50 mg  total) by mouth daily., Disp: 30 tablet, Rfl: 11 .  emtricitabine-tenofovir AF (DESCOVY) 200-25 MG tablet, Take 1 tablet by mouth daily., Disp: 30 tablet, Rfl: 11   Review of Systems  Constitutional: Negative for activity change, appetite change, chills, diaphoresis, fatigue, fever and unexpected weight change.  HENT: Negative for congestion, rhinorrhea, sinus pressure, sneezing, sore throat and trouble swallowing.   Eyes: Negative for photophobia.  Respiratory: Negative for cough, chest tightness, shortness of breath, wheezing and stridor.   Cardiovascular: Negative for chest pain, palpitations and leg swelling.  Gastrointestinal: Negative for abdominal distention, abdominal pain, anal bleeding, blood in stool, constipation, diarrhea, nausea and vomiting.  Genitourinary: Negative for dysuria, flank pain and hematuria.  Musculoskeletal: Negative for arthralgias, back pain, gait problem, joint swelling and myalgias.  Skin: Negative for color change, pallor, rash and wound.  Neurological: Negative for dizziness, tremors, weakness and light-headedness.  Hematological: Negative for adenopathy. Does not bruise/bleed easily.  Psychiatric/Behavioral: Negative for agitation, behavioral problems, confusion, decreased concentration, dysphoric mood and sleep disturbance.       Objective:   Physical Exam  Constitutional: He is oriented to person, place, and time. He appears well-developed and well-nourished.  HENT:  Head: Normocephalic and atraumatic.  Eyes: Conjunctivae and EOM are  normal.  Neck: Normal range of motion.  Cardiovascular: Normal rate and regular rhythm.  Pulmonary/Chest: Effort normal. No respiratory distress. He has no wheezes.  Abdominal: Soft. He exhibits no distension.  Musculoskeletal: Normal range of motion. He exhibits no edema or tenderness.  Neurological: He is alert and oriented to person, place, and time.  Skin: Skin is warm and dry. No rash noted. No erythema. No  pallor.  Psychiatric: He has a normal mood and affect. His behavior is normal. Judgment and thought content normal.  Nursing note and vitals reviewed.         Assessment & Plan:   HIV disease:  continue Tivicay and Descovy come back in July to renew program. Getting labs today and before his visit in July  I spent greater than 25 minutes with the patient including greater than 50% of time in face to face counsel of the patient re HIV disease, U=U concept and advocacy for PrEP, also with discussion about other regimens and in coordination of his care.

## 2017-02-02 LAB — CBC WITH DIFFERENTIAL/PLATELET
BASOS ABS: 29 {cells}/uL (ref 0–200)
Basophils Relative: 0.5 %
EOS ABS: 80 {cells}/uL (ref 15–500)
Eosinophils Relative: 1.4 %
HCT: 42.7 % (ref 38.5–50.0)
Hemoglobin: 14.6 g/dL (ref 13.2–17.1)
Lymphs Abs: 1818 cells/uL (ref 850–3900)
MCH: 30 pg (ref 27.0–33.0)
MCHC: 34.2 g/dL (ref 32.0–36.0)
MCV: 87.9 fL (ref 80.0–100.0)
MONOS PCT: 6.5 %
MPV: 10.1 fL (ref 7.5–12.5)
NEUTROS PCT: 59.7 %
Neutro Abs: 3403 cells/uL (ref 1500–7800)
PLATELETS: 265 10*3/uL (ref 140–400)
RBC: 4.86 10*6/uL (ref 4.20–5.80)
RDW: 12.8 % (ref 11.0–15.0)
TOTAL LYMPHOCYTE: 31.9 %
WBC mixed population: 371 cells/uL (ref 200–950)
WBC: 5.7 10*3/uL (ref 3.8–10.8)

## 2017-02-02 LAB — COMPLETE METABOLIC PANEL WITH GFR
AG RATIO: 1.5 (calc) (ref 1.0–2.5)
ALT: 16 U/L (ref 9–46)
AST: 13 U/L (ref 10–40)
Albumin: 4.5 g/dL (ref 3.6–5.1)
Alkaline phosphatase (APISO): 72 U/L (ref 40–115)
BILIRUBIN TOTAL: 0.4 mg/dL (ref 0.2–1.2)
BUN: 16 mg/dL (ref 7–25)
CALCIUM: 10.2 mg/dL (ref 8.6–10.3)
CO2: 26 mmol/L (ref 20–32)
Chloride: 105 mmol/L (ref 98–110)
Creat: 1.11 mg/dL (ref 0.60–1.35)
GFR, EST AFRICAN AMERICAN: 102 mL/min/{1.73_m2} (ref >=60)
GFR, EST NON AFRICAN AMERICAN: 88 mL/min/{1.73_m2} (ref >=60)
GLOBULIN: 3 g/dL (ref 1.9–3.7)
Glucose, Bld: 87 mg/dL (ref 65–99)
POTASSIUM: 4.4 mmol/L (ref 3.5–5.3)
SODIUM: 138 mmol/L (ref 135–146)
TOTAL PROTEIN: 7.5 g/dL (ref 6.1–8.1)

## 2017-02-02 LAB — T-HELPER CELL (CD4) - (RCID CLINIC ONLY)
CD4 % Helper T Cell: 21 % — ABNORMAL LOW (ref 33–55)
CD4 T Cell Abs: 410 /uL (ref 400–2700)

## 2017-02-02 LAB — RPR: RPR Ser Ql: NONREACTIVE

## 2017-02-02 LAB — URINE CYTOLOGY ANCILLARY ONLY
Chlamydia: NEGATIVE
Neisseria Gonorrhea: NEGATIVE

## 2017-02-03 LAB — HIV-1 RNA QUANT-NO REFLEX-BLD
HIV 1 RNA Quant: <20 copies/mL — AB
HIV-1 RNA Quant, Log: <1.3 Log copies/mL — AB

## 2017-02-25 ENCOUNTER — Encounter: Payer: Self-pay | Admitting: Infectious Disease

## 2017-03-14 ENCOUNTER — Telehealth: Payer: Self-pay | Admitting: *Deleted

## 2017-03-14 NOTE — Telephone Encounter (Signed)
Patient has new primary care physician, Marletta LorJulie Barr, NP.  Meka from her office is calling for labs. RN confirmed contact information with Meka, spoke with patient to get his permission. Care team updated, lab results (CBC, CMP, Lipids) faxed. Patient is ok sharing all records with new PCP. Andree CossHowell, Mera Gunkel M, RN

## 2017-07-19 ENCOUNTER — Other Ambulatory Visit: Payer: BLUE CROSS/BLUE SHIELD

## 2017-07-25 ENCOUNTER — Other Ambulatory Visit: Payer: BLUE CROSS/BLUE SHIELD

## 2017-07-27 ENCOUNTER — Other Ambulatory Visit: Payer: Self-pay | Admitting: Internal Medicine

## 2017-08-02 ENCOUNTER — Ambulatory Visit: Payer: BLUE CROSS/BLUE SHIELD | Admitting: Infectious Disease

## 2017-08-10 ENCOUNTER — Ambulatory Visit: Payer: BLUE CROSS/BLUE SHIELD | Admitting: Family

## 2017-08-10 ENCOUNTER — Encounter: Payer: Self-pay | Admitting: Family

## 2017-08-10 VITALS — BP 144/80 | HR 78 | Temp 98.0°F | Ht 71.0 in | Wt 147.0 lb

## 2017-08-10 DIAGNOSIS — B20 Human immunodeficiency virus [HIV] disease: Secondary | ICD-10-CM

## 2017-08-10 DIAGNOSIS — Z23 Encounter for immunization: Secondary | ICD-10-CM | POA: Diagnosis not present

## 2017-08-10 LAB — CBC
HEMATOCRIT: 39.8 % (ref 38.5–50.0)
HEMOGLOBIN: 13.5 g/dL (ref 13.2–17.1)
MCH: 30.2 pg (ref 27.0–33.0)
MCHC: 33.9 g/dL (ref 32.0–36.0)
MCV: 89 fL (ref 80.0–100.0)
MPV: 10.1 fL (ref 7.5–12.5)
Platelets: 283 10*3/uL (ref 140–400)
RBC: 4.47 10*6/uL (ref 4.20–5.80)
RDW: 12.7 % (ref 11.0–15.0)
WBC: 6.7 10*3/uL (ref 3.8–10.8)

## 2017-08-10 MED ORDER — EMTRICITABINE-TENOFOVIR AF 200-25 MG PO TABS: 1.0000 | ORAL_TABLET | Freq: Every day | ORAL | 5 refills | Status: DC

## 2017-08-10 MED ORDER — DOLUTEGRAVIR SODIUM 50 MG PO TABS: 50.0000 mg | ORAL_TABLET | Freq: Every day | ORAL | 5 refills | Status: DC

## 2017-08-10 NOTE — Assessment & Plan Note (Signed)
[name] appears to have well controlled HIV with good adherence to his medication regimen and no adverse side effects. He was not able to complete labs prior to his appointment and will complete them today. He has no signs/symptoms of opportunistic infection through history or physical exam. Prevnar and Menveo updated today. Recommend influenza vaccination in September. 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 Continue current dose of Tivicay and Descovy. Plan for follow up in 6 months or sooner if needed with blood work 1-2 weeks prior to appointment.

## 2017-08-10 NOTE — Patient Instructions (Signed)
Nice to meet you.  We will check your blood work today.  Plan for a flu shot in the month of September.  Follow up in 6 months or sooner with blood work 1-2 weeks prior to appointment.

## 2017-08-10 NOTE — Progress Notes (Signed)
Subjective:    Patient ID: Eddie Bruce, male    DOB: 21-Oct-1985, 32 y.o.   MRN: [id number]  Chief Complaint  Patient presents with  . HIV Positive/AIDS    HPI:  Eddie Bruce is a 32 y.o. male who presents today for routine follow up of his HIV disease.  Eddie Bruce was last seen in the office on 02/01/2017 for routine follow-up with well-controlled HIV with a viral load that was undetectable.  He was continued on his regimen of Tivicay and Descovy.  He does not have any updated blood work since his previous appointment.  He is due for Prevnar and meningococcal vaccinations.  Eddie Bruce is taking his medication as prescribed with no adverse side effects.  He has not had no missed dosages and has no problems taking or obtaining his medications. He continues to run a small business. Denies fevers, chills, night sweats, headaches, changes in vision, neck pain/stiffness, nausea, diarrhea, vomiting, lesions or rashes.   No Known Allergies    Outpatient Medications Prior to Visit  Medication Sig Dispense Refill  . busPIRone (BUSPAR) 15 MG tablet Take 15 mg by mouth 2 (two) times daily.    . DESCOVY 200-25 MG tablet TAKE 1 TABLET BY MOUTH DAILY 30 tablet 0  . dolutegravir (TIVICAY) 50 MG tablet Take 1 tablet (50 mg total) by mouth daily. 30 tablet 11  . emtricitabine-tenofovir AF (DESCOVY) 200-25 MG tablet Take 1 tablet by mouth daily. 30 tablet 11  . TIVICAY 50 MG tablet TAKE 1 TABLET BY MOUTH DAILY 30 tablet 0   No facility-administered medications prior to visit.      Past Medical History:  Diagnosis Date  . HIV disease (HCC) 06/09/2015     History reviewed. No pertinent surgical history.    Review of Systems  Constitutional: Negative for appetite change, chills, fatigue, fever and unexpected weight change.  Eyes: Negative for visual disturbance.  Respiratory: Negative for cough, chest tightness, shortness of breath and wheezing.   Cardiovascular: Negative for chest pain and  leg swelling.  Gastrointestinal: Negative for abdominal pain, constipation, diarrhea, nausea and vomiting.  Genitourinary: Negative for dysuria, flank pain, frequency, genital sores, hematuria and urgency.  Skin: Negative for rash.  Allergic/Immunologic: Negative for immunocompromised state.  Neurological: Negative for dizziness and headaches.      Objective:    BP (!) 144/80 (BP Location: Right Arm, Patient Position: Sitting, Cuff Size: Normal)   Pulse 78   Temp 98 F (36.7 C)   Ht 5\' 11"  (1.803 m)   Wt 147 lb (66.7 kg)   BMI 20.50 kg/m  Nursing note and vital signs reviewed.  Physical Exam  Constitutional: He is oriented to person, place, and time. He appears well-developed. No distress.  HENT:  Mouth/Throat: Oropharynx is clear and moist.  Eyes: Conjunctivae are normal.  Neck: Neck supple.  Cardiovascular: Normal rate, regular rhythm, normal heart sounds and intact distal pulses. Exam reveals no gallop and no friction rub.  No murmur heard. Pulmonary/Chest: Effort normal and breath sounds normal. No respiratory distress. He has no wheezes. He has no rales. He exhibits no tenderness.  Abdominal: Soft. Bowel sounds are normal. There is no tenderness.  Lymphadenopathy:    He has no cervical adenopathy.  Neurological: He is alert and oriented to person, place, and time.  Skin: Skin is warm and dry. No rash noted.  Psychiatric: He has a normal mood and affect. His behavior is normal. Judgment and thought content normal.  Assessment & Plan:   Problem List Items Addressed This Visit      Other   HIV disease (HCC) - Primary    Eddie Bruce appears to have well controlled HIV with good adherence to his medication regimen and no adverse side effects. He was not able to complete labs prior to his appointment and will complete them today. He has no signs/symptoms of opportunistic infection through history or physical exam. Prevnar and Menveo updated today. Recommend influenza  vaccination in September. 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 Continue current dose of Tivicay and Descovy. Plan for follow up in 6 months or sooner if needed with blood work 1-2 weeks prior to appointment.       Relevant Medications   dolutegravir (TIVICAY) 50 MG tablet   emtricitabine-tenofovir AF (DESCOVY) 200-25 MG tablet   Other Relevant Orders   T-helper cell (CD4)- ([facility] clinic only)   HIV 1 RNA quant-no reflex-bld   CBC   RPR   Lipid panel   Comprehensive metabolic panel   T-helper cell (CD4)- ([facility] clinic only)   HIV 1 RNA quant-no reflex-bld   RPR   Comprehensive metabolic panel   CBC    Other Visit Diagnoses    Need for pneumococcal vaccination       Relevant Orders   Pneumococcal conjugate vaccine 13-valent IM (Completed)   Need for prophylactic vaccination and inoculation against meningococcus       Relevant Orders   MENINGOCOCCAL MCV4O(MENVEO) (Completed)       I have discontinued Hardeep Westling's emtricitabine-tenofovir AF and dolutegravir. I have also changed his TIVICAY to dolutegravir and DESCOVY to emtricitabine-tenofovir AF. Additionally, I am having him maintain his busPIRone.   Meds ordered this encounter  Medications  . dolutegravir (TIVICAY) 50 MG tablet    Sig: Take 1 tablet (50 mg total) by mouth daily.    Dispense:  30 tablet    Refill:  5    Order Specific Question:   Supervising Provider    Answer:   Judyann MunsonSNIDER, CYNTHIA [[id number]]  . emtricitabine-tenofovir AF (DESCOVY) 200-25 MG tablet    Sig: Take 1 tablet by mouth daily.    Dispense:  30 tablet    Refill:  5    Order Specific Question:   Supervising Provider    Answer:   Judyann MunsonSNIDER, CYNTHIA [[id number]]     Follow-up: Return in about 6 months (around 02/10/2018), or if symptoms worsen or fail to improve.   Marcos EkeGreg Elam Ellis, MSN, FNP-C Nurse Practitioner [facility] Office phone: [phone] Pager: [phone] [facility] Main number:  [phone]

## 2017-08-11 LAB — T-HELPER CELL (CD4) - (RCID CLINIC ONLY)
CD4 T CELL ABS: 550 /uL (ref 400–2700)
CD4 T CELL HELPER: 23 % — AB (ref 33–55)

## 2017-08-12 LAB — COMPREHENSIVE METABOLIC PANEL
AG Ratio: 1.9 (calc) (ref 1.0–2.5)
ALKALINE PHOSPHATASE (APISO): 74 U/L (ref 40–115)
ALT: 22 U/L (ref 9–46)
AST: 21 U/L (ref 10–40)
Albumin: 4.9 g/dL (ref 3.6–5.1)
BUN: 17 mg/dL (ref 7–25)
CHLORIDE: 103 mmol/L (ref 98–110)
CO2: 25 mmol/L (ref 20–32)
CREATININE: 1.2 mg/dL (ref 0.60–1.35)
Calcium: 10.1 mg/dL (ref 8.6–10.3)
Globulin: 2.6 g/dL (calc) (ref 1.9–3.7)
Glucose, Bld: 88 mg/dL (ref 65–99)
Potassium: 4.1 mmol/L (ref 3.5–5.3)
Sodium: 138 mmol/L (ref 135–146)
Total Bilirubin: 0.3 mg/dL (ref 0.2–1.2)
Total Protein: 7.5 g/dL (ref 6.1–8.1)

## 2017-08-12 LAB — HIV-1 RNA QUANT-NO REFLEX-BLD
HIV 1 RNA QUANT: NOT DETECTED {copies}/mL
HIV-1 RNA Quant, Log: <1.3 Log copies/mL

## 2017-08-12 LAB — RPR: RPR: NONREACTIVE

## 2018-02-09 ENCOUNTER — Other Ambulatory Visit: Payer: BLUE CROSS/BLUE SHIELD

## 2018-02-23 ENCOUNTER — Encounter: Payer: BLUE CROSS/BLUE SHIELD | Admitting: Family

## 2018-03-17 ENCOUNTER — Other Ambulatory Visit: Payer: BLUE CROSS/BLUE SHIELD

## 2018-03-22 ENCOUNTER — Other Ambulatory Visit: Payer: Self-pay | Admitting: *Deleted

## 2018-03-22 ENCOUNTER — Other Ambulatory Visit (HOSPITAL_COMMUNITY)
Admission: RE | Admit: 2018-03-22 | Discharge: 2018-03-22 | Disposition: A | Discharge status: Home / Self Care | Payer: BLUE CROSS/BLUE SHIELD | Source: Ambulatory Visit | Attending: Family | Admitting: Family

## 2018-03-22 ENCOUNTER — Other Ambulatory Visit: Payer: BLUE CROSS/BLUE SHIELD

## 2018-03-22 ENCOUNTER — Other Ambulatory Visit: Payer: Self-pay

## 2018-03-22 DIAGNOSIS — Z113 Encounter for screening for infections with a predominantly sexual mode of transmission: Secondary | ICD-10-CM

## 2018-03-22 DIAGNOSIS — Z79899 Other long term (current) drug therapy: Secondary | ICD-10-CM

## 2018-03-22 DIAGNOSIS — B20 Human immunodeficiency virus [HIV] disease: Secondary | ICD-10-CM

## 2018-03-23 LAB — T-HELPER CELL (CD4) - (RCID CLINIC ONLY)
CD4 T CELL ABS: 580 /uL (ref 400–2700)
CD4 T CELL HELPER: 26 % — AB (ref 33–55)

## 2018-03-23 LAB — URINE CYTOLOGY ANCILLARY ONLY
Chlamydia: NEGATIVE
Neisseria Gonorrhea: NEGATIVE

## 2018-03-24 ENCOUNTER — Other Ambulatory Visit: Payer: Self-pay

## 2018-03-24 DIAGNOSIS — B20 Human immunodeficiency virus [HIV] disease: Secondary | ICD-10-CM

## 2018-03-24 LAB — COMPREHENSIVE METABOLIC PANEL
AG Ratio: 1.4 (calc) (ref 1.0–2.5)
ALT: 50 U/L — AB (ref 9–46)
AST: 42 U/L — ABNORMAL HIGH (ref 10–40)
Albumin: 4.6 g/dL (ref 3.6–5.1)
Alkaline phosphatase (APISO): 89 U/L (ref 36–130)
BUN: 19 mg/dL (ref 7–25)
CALCIUM: 10.2 mg/dL (ref 8.6–10.3)
CO2: 27 mmol/L (ref 20–32)
Chloride: 104 mmol/L (ref 98–110)
Creat: 1.14 mg/dL (ref 0.60–1.35)
Globulin: 3.2 g/dL (calc) (ref 1.9–3.7)
Glucose, Bld: 85 mg/dL (ref 65–99)
Potassium: 4.7 mmol/L (ref 3.5–5.3)
Sodium: 139 mmol/L (ref 135–146)
Total Bilirubin: 0.3 mg/dL (ref 0.2–1.2)
Total Protein: 7.8 g/dL (ref 6.1–8.1)

## 2018-03-24 LAB — CBC WITH DIFFERENTIAL/PLATELET
ABSOLUTE MONOCYTES: 632 {cells}/uL (ref 200–950)
BASOS ABS: 48 {cells}/uL (ref 0–200)
Basophils Relative: 0.6 %
Eosinophils Absolute: 88 cells/uL (ref 15–500)
Eosinophils Relative: 1.1 %
HCT: 41.4 % (ref 38.5–50.0)
Hemoglobin: 14.2 g/dL (ref 13.2–17.1)
Lymphs Abs: 2704 cells/uL (ref 850–3900)
MCH: 30.5 pg (ref 27.0–33.0)
MCHC: 34.3 g/dL (ref 32.0–36.0)
MCV: 89 fL (ref 80.0–100.0)
MPV: 10.1 fL (ref 7.5–12.5)
Monocytes Relative: 7.9 %
NEUTROS PCT: 56.6 %
Neutro Abs: 4528 cells/uL (ref 1500–7800)
PLATELETS: 350 10*3/uL (ref 140–400)
RBC: 4.65 10*6/uL (ref 4.20–5.80)
RDW: 12.9 % (ref 11.0–15.0)
TOTAL LYMPHOCYTE: 33.8 %
WBC: 8 10*3/uL (ref 3.8–10.8)

## 2018-03-24 LAB — LIPID PANEL
Cholesterol: 171 mg/dL (ref <200)
HDL: 50 mg/dL (ref >=40)
LDL Cholesterol (Calc): 89 mg/dL (calc)
Non-HDL Cholesterol (Calc): 121 mg/dL (calc) (ref <130)
Total CHOL/HDL Ratio: 3.4 (calc) (ref <5.0)
Triglycerides: 230 mg/dL — ABNORMAL HIGH (ref <150)

## 2018-03-24 LAB — HIV-1 RNA QUANT-NO REFLEX-BLD
HIV 1 RNA QUANT: NOT DETECTED {copies}/mL
HIV-1 RNA Quant, Log: <1.3 Log copies/mL

## 2018-03-24 LAB — RPR: RPR Ser Ql: NONREACTIVE

## 2018-03-24 MED ORDER — DOLUTEGRAVIR SODIUM 50 MG PO TABS: 50.0000 mg | ORAL_TABLET | Freq: Every day | ORAL | 0 refills | Status: DC

## 2018-03-24 MED ORDER — EMTRICITABINE-TENOFOVIR AF 200-25 MG PO TABS: 1.0000 | ORAL_TABLET | Freq: Every day | ORAL | 0 refills | Status: DC

## 2018-03-31 ENCOUNTER — Encounter: Payer: Self-pay | Admitting: Family

## 2018-03-31 ENCOUNTER — Ambulatory Visit: Payer: BLUE CROSS/BLUE SHIELD | Admitting: Family

## 2018-03-31 ENCOUNTER — Other Ambulatory Visit: Payer: Self-pay

## 2018-03-31 VITALS — BP 115/68 | HR 77 | Temp 98.5°F | Wt 143.0 lb

## 2018-03-31 DIAGNOSIS — Z0000 Encounter for general adult medical examination without abnormal findings: Secondary | ICD-10-CM

## 2018-03-31 DIAGNOSIS — Z113 Encounter for screening for infections with a predominantly sexual mode of transmission: Secondary | ICD-10-CM

## 2018-03-31 DIAGNOSIS — R748 Abnormal levels of other serum enzymes: Secondary | ICD-10-CM | POA: Diagnosis not present

## 2018-03-31 DIAGNOSIS — B20 Human immunodeficiency virus [HIV] disease: Secondary | ICD-10-CM | POA: Diagnosis not present

## 2018-03-31 MED ORDER — BICTEGRAVIR-EMTRICITAB-TENOFOV 50-200-25 MG PO TABS: 1.0000 | ORAL_TABLET | Freq: Every day | ORAL | 5 refills | Status: DC

## 2018-03-31 NOTE — Assessment & Plan Note (Signed)
Eddie Bruce has well-controlled HIV disease with his current regimen of Tivicay and Descovy with good adherence and tolerance.  He is requesting to change medications to a simpler regimen.  No signs/symptoms of opportunistic infection or progressive HIV disease at present.  He has no problems obtaining his medications.  Discontinue Tivicay and Descovy and start Biktarvy.  Plan for office pharmacy/lab follow-up in 1 month or sooner if needed and with provider in 6 months or sooner if needed with lab work 1 to 2 weeks prior to appointment.

## 2018-03-31 NOTE — Assessment & Plan Note (Addendum)
   All immunizations are up-to-date per recommendations.  Referral to [facility] dental clinic for routine dental care.  Discussed importance of safe sexual practice to reduce risk of acquisition and transmission of STI. Declines condoms today.  Depression screen negative.

## 2018-03-31 NOTE — Assessment & Plan Note (Signed)
Mr. Betten has elevated liver enzymes of unclear origin with no significant alcohol and Tylenol usage.  Concern for possible early fatty liver disease.  Discussed importance of reducing processed/sugary foods and carbohydrate intake.  Recheck at next office visit.  If remaining elevated may consider ultrasound.

## 2018-03-31 NOTE — Patient Instructions (Signed)
Nice to see you.  We will change your medication to [facility].  Please STOP taking the Descovy and Tivicay once you receive the Biktarvy.  Plan for follow up with Pharmacy in 1 month for lab work and monitoring and then follow up with provider in 6 months or sooner if needed with lab work 1-2 weeks prior to appointment.   Have a great day!

## 2018-03-31 NOTE — Progress Notes (Signed)
Subjective:    Patient ID: Eddie Bruce, male    DOB: 1985-06-20, 33 y.o.   MRN: [id number]  Chief Complaint  Patient presents with  . HIV Positive/AIDS     HPI:  Eddie Bruce is a 33 y.o. male who presents today for routine follow-up of HIV disease.  Eddie Bruce was last seen in the office on 08/10/2017 for routine follow-up with good adherence and tolerance to his ART regimen of Tivicay and Descovy.  Viral load of the time was found to be undetectable with CD4 count of 550.  Most recent blood work completed on 03/22/2018 with viral load remaining undetectable with CD4 count of 580.  Kidney function and electrolytes within normal ranges.  Liver function tests slightly elevated with AST of 42 and ALT of 50.  Lipid profile with LDL of 89, triglycerides 230, and HDL of 50.  RPR was negative for syphilis with gonorrhea and Chlamydia testing negative.  All immunizations are currently up-to-date per recommendations.  Eddie Bruce continues to take his Tivicay and Descovy as prescribed with no adverse side effects or missed doses.  Overall feeling very well today.  Wishes to consider changing his medication to a 1 pill regimen. Denies fevers, chills, night sweats, headaches, changes in vision, neck pain/stiffness, nausea, diarrhea, vomiting, lesions or rashes.  Eddie Bruce continues to receive his medication without problems from mail order and remains covered through [employer].  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  Not currently sexually active and declines condoms.  No recreational or illicit drug use presently.  Denies feeling down, depressed, or hopeless in the last 2 weeks.  He is due for a dental exam and has not been in several years.   No Known Allergies    Outpatient Medications Prior to Visit  Medication Sig Dispense Refill  . busPIRone (BUSPAR) 15 MG tablet Take 15 mg by mouth 2 (two) times daily.    . dolutegravir  (TIVICAY) 50 MG tablet Take 1 tablet (50 mg total) by mouth daily. 30 tablet 0  . emtricitabine-tenofovir AF (DESCOVY) 200-25 MG tablet Take 1 tablet by mouth daily. 30 tablet 0  . ALPRAZolam (NIRAVAM) 0.25 MG dissolvable tablet DIS 1 T ON THE TONGUE BID PRA     No facility-administered medications prior to visit.      Past Medical History:  Diagnosis Date  . HIV disease (HCC) 06/09/2015     History reviewed. No pertinent surgical history.     Review of Systems  Constitutional: Negative for appetite change, chills, fatigue, fever and unexpected weight change.  Eyes: Negative for visual disturbance.  Respiratory: Negative for cough, chest tightness, shortness of breath and wheezing.   Cardiovascular: Negative for chest pain and leg swelling.  Gastrointestinal: Negative for abdominal pain, constipation, diarrhea, nausea and vomiting.  Genitourinary: Negative for dysuria, flank pain, frequency, genital sores, hematuria and urgency.  Skin: Negative for rash.  Allergic/Immunologic: Negative for immunocompromised state.  Neurological: Negative for dizziness and headaches.      Objective:    BP 115/68   Pulse 77   Temp 98.5 F (36.9 C) (Oral)   Wt 143 lb (64.9 kg)   BMI 19.94 kg/m  Nursing note and vital signs reviewed.  Physical Exam Constitutional:      General: He is not in acute distress.    Appearance: He is well-developed.  Eyes:     Conjunctiva/sclera: Conjunctivae normal.  Neck:  Musculoskeletal: Neck supple.  Cardiovascular:     Rate and Rhythm: Normal rate and regular rhythm.     Heart sounds: Normal heart sounds. No murmur. No friction rub. No gallop.   Pulmonary:     Effort: Pulmonary effort is normal. No respiratory distress.     Breath sounds: Normal breath sounds. No wheezing or rales.  Chest:     Chest wall: No tenderness.  Abdominal:     General: Bowel sounds are normal.     Palpations: Abdomen is soft.     Tenderness: There is no abdominal  tenderness.  Lymphadenopathy:     Cervical: No cervical adenopathy.  Skin:    General: Skin is warm and dry.     Findings: No rash.  Neurological:     Mental Status: He is alert and oriented to person, place, and time.  Psychiatric:        Behavior: Behavior normal.        Thought Content: Thought content normal.        Judgment: Judgment normal.        Assessment & Plan:   Problem List Items Addressed This Visit      Other   HIV disease (HCC) - Primary    Eddie Bruce has well-controlled HIV disease with his current regimen of Tivicay and Descovy with good adherence and tolerance.  He is requesting to change medications to a simpler regimen.  No signs/symptoms of opportunistic infection or progressive HIV disease at present.  He has no problems obtaining his medications.  Discontinue Tivicay and Descovy and start Biktarvy.  Plan for office pharmacy/lab follow-up in 1 month or sooner if needed and with provider in 6 months or sooner if needed with lab work 1 to 2 weeks prior to appointment.      Relevant Medications   bictegravir-emtricitabine-tenofovir AF (BIKTARVY) 50-200-25 MG TABS tablet   Other Relevant Orders   T-helper cell (CD4)- ([facility] clinic only)   HIV-1 RNA quant-no reflex-bld   CBC   Comprehensive metabolic panel   Healthcare maintenance     All immunizations are up-to-date per recommendations.  Referral to [facility] dental clinic for routine dental care.  Discussed importance of safe sexual practice to reduce risk of acquisition and transmission of STI. Declines condoms today.  Depression screen negative.       Elevated liver enzymes    Eddie Bruce has elevated liver enzymes of unclear origin with no significant alcohol and Tylenol usage.  Concern for possible early fatty liver disease.  Discussed importance of reducing processed/sugary foods and carbohydrate intake.  Recheck at next office visit.  If remaining elevated may consider ultrasound.       Other Visit  Diagnoses    Screening for STDs (sexually transmitted diseases)       Relevant Orders   RPR   Urine cytology ancillary only([place])       I have discontinued Eddie Bruce's dolutegravir and emtricitabine-tenofovir AF. I am also having him start on bictegravir-emtricitabine-tenofovir AF. Additionally, I am having him maintain his busPIRone and ALPRAZolam.   Meds ordered this encounter  Medications  . bictegravir-emtricitabine-tenofovir AF (BIKTARVY) 50-200-25 MG TABS tablet    Sig: Take 1 tablet by mouth daily.    Dispense:  30 tablet    Refill:  5    Order Specific Question:   Supervising Provider    Answer:   Judyann Munson [[id number]]     Follow-up: Return in about 6 months (around 10/01/2018), or if symptoms  worsen or fail to improve.   Terri Piedra, MSN, FNP-C Nurse Practitioner [facility] [place] number: [phone]

## 2018-05-01 ENCOUNTER — Ambulatory Visit: Payer: BLUE CROSS/BLUE SHIELD | Admitting: Pharmacist

## 2018-09-12 ENCOUNTER — Other Ambulatory Visit: Payer: BLUE CROSS/BLUE SHIELD

## 2018-09-26 ENCOUNTER — Encounter: Payer: BLUE CROSS/BLUE SHIELD | Admitting: Infectious Disease

## 2018-10-05 ENCOUNTER — Other Ambulatory Visit: Payer: Self-pay | Admitting: Family

## 2018-10-05 DIAGNOSIS — B20 Human immunodeficiency virus [HIV] disease: Secondary | ICD-10-CM

## 2018-10-17 ENCOUNTER — Other Ambulatory Visit: Payer: BLUE CROSS/BLUE SHIELD

## 2018-10-26 ENCOUNTER — Other Ambulatory Visit: Payer: Self-pay

## 2018-10-26 ENCOUNTER — Other Ambulatory Visit: Payer: BLUE CROSS/BLUE SHIELD

## 2018-10-26 DIAGNOSIS — Z113 Encounter for screening for infections with a predominantly sexual mode of transmission: Secondary | ICD-10-CM

## 2018-10-26 DIAGNOSIS — B20 Human immunodeficiency virus [HIV] disease: Secondary | ICD-10-CM

## 2018-10-27 LAB — T-HELPER CELL (CD4) - (RCID CLINIC ONLY)
CD4 % Helper T Cell: 31 % — ABNORMAL LOW (ref 33–65)
CD4 T Cell Abs: 741 /uL (ref 400–1790)

## 2018-10-30 ENCOUNTER — Other Ambulatory Visit: Payer: Self-pay

## 2018-10-30 ENCOUNTER — Telehealth: Payer: Self-pay

## 2018-10-30 NOTE — Telephone Encounter (Signed)
COVID-19 Pre-Screening Questions: ° °Do you currently have a fever (>100 °F), chills or unexplained body aches? N ° °Are you currently experiencing new cough, shortness of breath, sore throat, runny nose? N °•  °Have you recently travelled outside the state of [place] in the last 14 days? N  °•  °Have you been in contact with someone that is currently pending confirmation of Covid19 testing or has been confirmed to have the Covid19 virus?  N ° °**If the patient answers NO to ALL questions -  advise the patient to please call the clinic before coming to the office should any symptoms develop.  ° ° ° °

## 2018-10-31 ENCOUNTER — Encounter: Payer: Self-pay | Admitting: Infectious Disease

## 2018-10-31 ENCOUNTER — Other Ambulatory Visit: Payer: Self-pay

## 2018-10-31 ENCOUNTER — Ambulatory Visit: Payer: BLUE CROSS/BLUE SHIELD | Admitting: Infectious Disease

## 2018-10-31 VITALS — BP 120/69 | HR 73 | Temp 97.9°F | Wt 142.2 lb

## 2018-10-31 DIAGNOSIS — Z23 Encounter for immunization: Secondary | ICD-10-CM | POA: Diagnosis not present

## 2018-10-31 DIAGNOSIS — B20 Human immunodeficiency virus [HIV] disease: Secondary | ICD-10-CM

## 2018-10-31 DIAGNOSIS — R748 Abnormal levels of other serum enzymes: Secondary | ICD-10-CM | POA: Diagnosis not present

## 2018-10-31 DIAGNOSIS — Z0000 Encounter for general adult medical examination without abnormal findings: Secondary | ICD-10-CM | POA: Diagnosis not present

## 2018-10-31 LAB — CBC
HCT: 42.8 % (ref 38.5–50.0)
Hemoglobin: 14.3 g/dL (ref 13.2–17.1)
MCH: 29.5 pg (ref 27.0–33.0)
MCHC: 33.4 g/dL (ref 32.0–36.0)
MCV: 88.2 fL (ref 80.0–100.0)
MPV: 10.1 fL (ref 7.5–12.5)
Platelets: 291 10*3/uL (ref 140–400)
RBC: 4.85 10*6/uL (ref 4.20–5.80)
RDW: 12.4 % (ref 11.0–15.0)
WBC: 5.9 10*3/uL (ref 3.8–10.8)

## 2018-10-31 LAB — COMPREHENSIVE METABOLIC PANEL
AG Ratio: 1.4 (calc) (ref 1.0–2.5)
ALT: 47 U/L — ABNORMAL HIGH (ref 9–46)
AST: 23 U/L (ref 10–40)
Albumin: 4.4 g/dL (ref 3.6–5.1)
Alkaline phosphatase (APISO): 78 U/L (ref 36–130)
BUN: 19 mg/dL (ref 7–25)
CO2: 24 mmol/L (ref 20–32)
Calcium: 10.2 mg/dL (ref 8.6–10.3)
Chloride: 104 mmol/L (ref 98–110)
Creat: 1 mg/dL (ref 0.60–1.35)
Globulin: 3.1 g/dL (calc) (ref 1.9–3.7)
Glucose, Bld: 101 mg/dL — ABNORMAL HIGH (ref 65–99)
Potassium: 4.5 mmol/L (ref 3.5–5.3)
Sodium: 138 mmol/L (ref 135–146)
Total Bilirubin: 0.4 mg/dL (ref 0.2–1.2)
Total Protein: 7.5 g/dL (ref 6.1–8.1)

## 2018-10-31 LAB — HIV-1 RNA QUANT-NO REFLEX-BLD
HIV 1 RNA Quant: 56 copies/mL — ABNORMAL HIGH
HIV-1 RNA Quant, Log: 1.75 Log copies/mL — ABNORMAL HIGH

## 2018-10-31 LAB — RPR: RPR Ser Ql: NONREACTIVE

## 2018-10-31 MED ORDER — BIKTARVY 50-200-25 MG PO TABS: 1.0000 | ORAL_TABLET | Freq: Every day | ORAL | 11 refills | Status: DC

## 2018-10-31 NOTE — Progress Notes (Signed)
Subjective:   Chief complaint: followup for HIV disease on meds   Patient ID: Eddie Bruce, male    DOB: [birth year], 33 y.o.   MRN: [id number]  HPI  1 -year-old Caucasian man with perfectly controlled HIV.  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  He was switched to [facility] and maintains very nice virological suppression most recent viral load was 56 which is likely a blip.  He states he missed potentially 3 doses of meds over a 4-month time.  He has no other complaints today and is doing well.    Lab Results  Component Value Date   HIV1RNAQUANT 56 (H) 10/26/2018   HIV1RNAQUANT <20 NOT DETECTED 03/22/2018   HIV1RNAQUANT <20 NOT DETECTED 08/10/2017   Lab Results  Component Value Date   CD4TABS 741 10/26/2018   CD4TABS 580 03/22/2018   CD4TABS 550 08/10/2017   He likes his Tivicay and Descovy and does not want to change meds.   Past Medical History:  Diagnosis Date  . HIV disease (HCC) 06/09/2015    No past surgical history on file.  No family history on file.    Social History   Socioeconomic History  . Marital status: Single    Spouse name: Not on file  . Number of children: Not on file  . Years of education: Not on file  . Highest education level: Not on file  Occupational History  . Not on file  Social Needs  . Financial resource strain: Not on file  . Food insecurity    Worry: Not on file    Inability: Not on file  . Transportation needs    Medical: Not on file    Non-medical: Not on file  Tobacco Use  . Smoking status: Current Every Day Smoker    Packs/day: 0.25    Years: 15.00    Pack years: 3.75    Types: Cigarettes  . Smokeless tobacco: Never Used  . Tobacco comment: cutting back  Substance and Sexual Activity  . Alcohol use: Yes    Alcohol/week: 0.0 standard drinks    Comment: rarely  . Drug use: No  . Sexual activity: Yes    Partners: Male    Birth control/protection: None    Comment:  pt. declined condoms 10/2018  Lifestyle  . Physical activity    Days per week: Not on file    Minutes per session: Not on file  . Stress: Not on file  Relationships  . Social Musician on phone: Not on file    Gets together: Not on file    Attends religious service: Not on file    Active member of club or organization: Not on file    Attends meetings of clubs or organizations: Not on file    Relationship status: Not on file  Other Topics Concern  . Not on file  Social History Narrative  . Not on file    No Known Allergies   Current Outpatient Medications:  .  ALPRAZolam (NIRAVAM) 0.25 MG dissolvable tablet, DIS 1 T ON THE TONGUE BID PRA, Disp: , Rfl:  .  ALPRAZolam (XANAX) 0.25 MG tablet, Take 0.25 mg by mouth 2 (two) times daily as needed., Disp: , Rfl:  .  BIKTARVY 50-200-25 MG TABS tablet, TAKE 1 TABLET BY MOUTH DAILY., Disp: 30 tablet, Rfl: 0 .  busPIRone (BUSPAR) 15 MG tablet, Take 15 mg by mouth 2 (two) times daily., Disp: ,  Rfl:    Review of Systems  Constitutional: Negative for activity change, appetite change, chills, diaphoresis, fatigue, fever and unexpected weight change.  HENT: Negative for congestion, rhinorrhea, sinus pressure, sneezing, sore throat and trouble swallowing.   Eyes: Negative for photophobia.  Respiratory: Negative for cough, chest tightness, shortness of breath, wheezing and stridor.   Cardiovascular: Negative for chest pain, palpitations and leg swelling.  Gastrointestinal: Negative for abdominal distention, abdominal pain, anal bleeding, blood in stool, constipation, diarrhea, nausea and vomiting.  Genitourinary: Negative for dysuria, flank pain and hematuria.  Musculoskeletal: Negative for arthralgias, back pain, gait problem, joint swelling and myalgias.  Skin: Negative for color change, pallor, rash and wound.  Neurological: Negative for dizziness, tremors, weakness and light-headedness.  Hematological: Negative for adenopathy.  Does not bruise/bleed easily.  Psychiatric/Behavioral: Negative for agitation, behavioral problems, confusion, decreased concentration, dysphoric mood and sleep disturbance. The patient is not nervous/anxious.        Objective:   Physical Exam  Constitutional: He is oriented to person, place, and time. He appears well-developed and well-nourished.  HENT:  Head: Normocephalic and atraumatic.  Eyes: Conjunctivae and EOM are normal.  Neck: Normal range of motion.  Cardiovascular: Normal rate and regular rhythm.  Pulmonary/Chest: Effort normal. No respiratory distress. He has no wheezes.  Abdominal: Soft. He exhibits no distension.  Musculoskeletal: Normal range of motion.        General: No tenderness, deformity or edema.  Neurological: He is alert and oriented to person, place, and time.  Skin: Skin is warm and dry. No rash noted. No erythema. No pallor.  Psychiatric: He has a normal mood and affect. His behavior is normal. Judgment and thought content normal.  Nursing note and vitals reviewed.         Assessment & Plan:   HIV disease: Switch to [facility] and we will continue this.  He will get a flu shot today we will see him back in 1 years time.  Transaminase elevation he has had some minor elevations in his transaminases.  Not sure what the significance of this is there are borderline abnormal.  He says he occasionally drinks some small amount of alcohol here and there but certainly not regularly and not on every day basis. e.

## 2018-11-06 ENCOUNTER — Other Ambulatory Visit: Payer: Self-pay

## 2018-11-06 DIAGNOSIS — B20 Human immunodeficiency virus [HIV] disease: Secondary | ICD-10-CM

## 2018-11-06 MED ORDER — BIKTARVY 50-200-25 MG PO TABS: 1.0000 | ORAL_TABLET | Freq: Every day | ORAL | 11 refills | Status: DC

## 2019-02-06 ENCOUNTER — Other Ambulatory Visit: Payer: Self-pay

## 2019-02-06 DIAGNOSIS — B20 Human immunodeficiency virus [HIV] disease: Secondary | ICD-10-CM

## 2019-02-06 MED ORDER — BIKTARVY 50-200-25 MG PO TABS: 1.0000 | ORAL_TABLET | Freq: Every day | ORAL | 5 refills | Status: DC

## 2019-02-07 ENCOUNTER — Telehealth: Payer: Self-pay

## 2019-02-07 ENCOUNTER — Other Ambulatory Visit: Payer: Self-pay

## 2019-02-07 DIAGNOSIS — B20 Human immunodeficiency virus [HIV] disease: Secondary | ICD-10-CM

## 2019-02-07 MED ORDER — BIKTARVY 50-200-25 MG PO TABS: 1.0000 | ORAL_TABLET | Freq: Every day | ORAL | 1 refills | Status: DC

## 2019-02-07 NOTE — Telephone Encounter (Signed)
Pharmacy called office today stating patient's insurance will only cover 90 day supply of Biktarvy. Would like new prescription sent in. Prescription resent as ordered.  Lorenso Courier, [place]

## 2019-02-09 ENCOUNTER — Telehealth: Payer: Self-pay

## 2019-02-09 NOTE — Telephone Encounter (Signed)
Prior Authorization completed for patient's Biktarvy. Awaiting reply.  Char Feltman Loyola Mast, RN

## 2019-02-12 NOTE — Telephone Encounter (Signed)
PA approved from 02/09/19-02/09/20. Will fax approval to pharmacy. Lorenso Courier, [place]

## 2019-04-12 ENCOUNTER — Ambulatory Visit: Payer: Self-pay | Attending: Internal Medicine

## 2019-04-12 DIAGNOSIS — Z23 Encounter for immunization: Secondary | ICD-10-CM

## 2019-04-12 NOTE — Progress Notes (Signed)
   Covid-19 Vaccination Clinic  Name:  [name]    MRN: [id number] DOB: 1985/12/20  04/12/2019  Mr. [name] was observed post Covid-19 immunization for 15 minutes without incident. He was provided with Vaccine Information Sheet and instruction to access the V-Safe system.   Mr. [name] was instructed to call 911 with any severe reactions post vaccine: Marland Kitchen Difficulty breathing  . Swelling of face and throat  . A fast heartbeat  . A bad rash all over body  . Dizziness and weakness   Immunizations Administered    Name Date Dose VIS Date Route   Pfizer COVID-19 Vaccine 04/12/2019  8:29 AM 0.3 mL 12/15/2018 Intramuscular   Manufacturer: [employer], [employer]   Lot: [id number]   NDC: [address]-9

## 2019-05-07 ENCOUNTER — Ambulatory Visit: Payer: Self-pay | Attending: Internal Medicine

## 2019-05-07 DIAGNOSIS — Z23 Encounter for immunization: Secondary | ICD-10-CM

## 2019-05-07 NOTE — Progress Notes (Signed)
   Covid-19 Vaccination Clinic  Name:  Eddie Bruce    MRN: [id number] DOB: 1985-03-01  05/07/2019  [name] was observed post Covid-19 immunization for 15 minutes without incident. He was provided with Vaccine Information Sheet and instruction to access the V-Safe system.   [name] was instructed to call 911 with any severe reactions post vaccine: Marland Kitchen Difficulty breathing  . Swelling of face and throat  . A fast heartbeat  . A bad rash all over body  . Dizziness and weakness   Immunizations Administered    Name Date Dose VIS Date Route   Pfizer COVID-19 Vaccine 05/07/2019  8:20 AM 0.3 mL 02/28/2018 Intramuscular   Manufacturer: [employer], [employer]   Lot: [id number]   NDC: [address]-0

## 2019-06-22 ENCOUNTER — Telehealth: Payer: Self-pay

## 2019-06-22 DIAGNOSIS — B20 Human immunodeficiency virus [HIV] disease: Secondary | ICD-10-CM

## 2019-06-22 MED ORDER — BIKTARVY 50-200-25 MG PO TABS: 1.0000 | ORAL_TABLET | Freq: Every day | ORAL | 1 refills | Status: DC

## 2019-06-22 NOTE — Telephone Encounter (Signed)
Received refill request via fax. Biktarvy 90 day supply approved with 1 refill.  Valarie Cones

## 2019-10-29 ENCOUNTER — Encounter: Payer: Self-pay | Admitting: Physician Assistant

## 2019-11-01 ENCOUNTER — Other Ambulatory Visit: Payer: BLUE CROSS/BLUE SHIELD

## 2019-11-12 ENCOUNTER — Other Ambulatory Visit: Payer: 59

## 2019-11-12 ENCOUNTER — Other Ambulatory Visit: Payer: Self-pay

## 2019-11-12 DIAGNOSIS — B20 Human immunodeficiency virus [HIV] disease: Secondary | ICD-10-CM

## 2019-11-13 LAB — T-HELPER CELL (CD4) - (RCID CLINIC ONLY)
CD4 % Helper T Cell: 34 % (ref 33–65)
CD4 T Cell Abs: 791 /uL (ref 400–1790)

## 2019-11-16 LAB — CBC WITH DIFFERENTIAL/PLATELET
Absolute Monocytes: 511 cells/uL (ref 200–950)
Basophils Absolute: 28 cells/uL (ref 0–200)
Basophils Relative: 0.4 %
Eosinophils Absolute: 92 cells/uL (ref 15–500)
Eosinophils Relative: 1.3 %
HCT: 42.5 % (ref 38.5–50.0)
Hemoglobin: 14.3 g/dL (ref 13.2–17.1)
Lymphs Abs: 2400 cells/uL (ref 850–3900)
MCH: 30.6 pg (ref 27.0–33.0)
MCHC: 33.6 g/dL (ref 32.0–36.0)
MCV: 91 fL (ref 80.0–100.0)
MPV: 10.3 fL (ref 7.5–12.5)
Monocytes Relative: 7.2 %
Neutro Abs: 4068 cells/uL (ref 1500–7800)
Neutrophils Relative %: 57.3 %
Platelets: 335 10*3/uL (ref 140–400)
RBC: 4.67 10*6/uL (ref 4.20–5.80)
RDW: 12.8 % (ref 11.0–15.0)
Total Lymphocyte: 33.8 %
WBC: 7.1 10*3/uL (ref 3.8–10.8)

## 2019-11-16 LAB — COMPLETE METABOLIC PANEL WITH GFR
AG Ratio: 1.6 (calc) (ref 1.0–2.5)
ALT: 21 U/L (ref 9–46)
AST: 16 U/L (ref 10–40)
Albumin: 4.7 g/dL (ref 3.6–5.1)
Alkaline phosphatase (APISO): 76 U/L (ref 36–130)
BUN: 12 mg/dL (ref 7–25)
CO2: 28 mmol/L (ref 20–32)
Calcium: 10.7 mg/dL — ABNORMAL HIGH (ref 8.6–10.3)
Chloride: 103 mmol/L (ref 98–110)
Creat: 1.07 mg/dL (ref 0.60–1.35)
GFR, Est African American: 104 mL/min/{1.73_m2} (ref >=60)
GFR, Est Non African American: 90 mL/min/{1.73_m2} (ref >=60)
Globulin: 2.9 g/dL (calc) (ref 1.9–3.7)
Glucose, Bld: 66 mg/dL (ref 65–99)
Potassium: 4.5 mmol/L (ref 3.5–5.3)
Sodium: 137 mmol/L (ref 135–146)
Total Bilirubin: 0.3 mg/dL (ref 0.2–1.2)
Total Protein: 7.6 g/dL (ref 6.1–8.1)

## 2019-11-16 LAB — HIV-1 RNA QUANT-NO REFLEX-BLD
HIV 1 RNA Quant: 25 Copies/mL — ABNORMAL HIGH
HIV-1 RNA Quant, Log: 1.39 Log cps/mL — ABNORMAL HIGH

## 2019-11-16 LAB — LIPID PANEL
Cholesterol: 167 mg/dL (ref <200)
HDL: 33 mg/dL — ABNORMAL LOW (ref >=40)
LDL Cholesterol (Calc): 92 mg/dL (calc)
Non-HDL Cholesterol (Calc): 134 mg/dL (calc) — ABNORMAL HIGH (ref <130)
Total CHOL/HDL Ratio: 5.1 (calc) — ABNORMAL HIGH (ref <5.0)
Triglycerides: 316 mg/dL — ABNORMAL HIGH (ref <150)

## 2019-11-16 LAB — RPR: RPR Ser Ql: NONREACTIVE

## 2019-11-19 ENCOUNTER — Encounter: Payer: BLUE CROSS/BLUE SHIELD | Admitting: Infectious Disease

## 2019-11-28 ENCOUNTER — Ambulatory Visit (INDEPENDENT_AMBULATORY_CARE_PROVIDER_SITE_OTHER): Payer: 59 | Admitting: Infectious Disease

## 2019-11-28 ENCOUNTER — Other Ambulatory Visit: Payer: Self-pay

## 2019-11-28 ENCOUNTER — Encounter: Payer: Self-pay | Admitting: Infectious Disease

## 2019-11-28 VITALS — BP 142/76 | HR 94 | Temp 98.0°F | Wt 158.0 lb

## 2019-11-28 DIAGNOSIS — B20 Human immunodeficiency virus [HIV] disease: Secondary | ICD-10-CM | POA: Diagnosis not present

## 2019-11-28 DIAGNOSIS — Z23 Encounter for immunization: Secondary | ICD-10-CM

## 2019-11-28 MED ORDER — BIKTARVY 50-200-25 MG PO TABS: 1.0000 | ORAL_TABLET | Freq: Every day | ORAL | 4 refills | Status: DC

## 2019-11-28 NOTE — Progress Notes (Signed)
Subjective:   Chief complaint: followup for HIV disease on meds   Patient ID: Eddie Bruce, male    DOB: November 24, 1985, 34 y.o.   MRN: [id number]  HPI  12 -year-old Caucasian man with perfectly controlled HIV.  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  He was switched to [facility] and maintains very nice virological suppression   He is fully vaccinated gets COVID-19 and also has his flu vaccine.      Lab Results  Component Value Date   HIV1RNAQUANT 25 (H) 11/12/2019   HIV1RNAQUANT 56 (H) 10/26/2018   HIV1RNAQUANT <20 NOT DETECTED 03/22/2018   Lab Results  Component Value Date   CD4TABS 791 11/12/2019   CD4TABS 741 10/26/2018   CD4TABS 580 03/22/2018   He likes his Tivicay and Descovy and does not want to change meds.   Past Medical History:  Diagnosis Date  . HIV disease (HCC) 06/09/2015    No past surgical history on file.  No family history on file.    Social History   Socioeconomic History  . Marital status: Single    Spouse name: Not on file  . Number of children: Not on file  . Years of education: Not on file  . Highest education level: Not on file  Occupational History  . Not on file  Tobacco Use  . Smoking status: Current Every Day Smoker    Packs/day: 0.25    Years: 15.00    Pack years: 3.75    Types: Cigarettes  . Smokeless tobacco: Never Used  . Tobacco comment: cutting back  Substance and Sexual Activity  . Alcohol use: Yes    Alcohol/week: 0.0 standard drinks    Comment: rarely  . Drug use: No  . Sexual activity: Yes    Partners: Male    Birth control/protection: None    Comment: pt. declined condoms 10/2018  Other Topics Concern  . Not on file  Social History Narrative  . Not on file   Social Determinants of Health   Financial Resource Strain:   . Difficulty of Paying Living Expenses: Not on file  Food Insecurity:   . Worried About Programme researcher, broadcasting/film/video in the Last  Year: Not on file  . Ran Out of Food in the Last Year: Not on file  Transportation Needs:   . Lack of Transportation (Medical): Not on file  . Lack of Transportation (Non-Medical): Not on file  Physical Activity:   . Days of Exercise per Week: Not on file  . Minutes of Exercise per Session: Not on file  Stress:   . Feeling of Stress : Not on file  Social Connections:   . Frequency of Communication with Friends and Family: Not on file  . Frequency of Social Gatherings with Friends and Family: Not on file  . Attends Religious Services: Not on file  . Active Member of Clubs or Organizations: Not on file  . Attends Banker Meetings: Not on file  . Marital Status: Not on file    No Known Allergies   Current Outpatient Medications:  .  ALPRAZolam (NIRAVAM) 0.25 MG dissolvable tablet, DIS 1 T ON THE TONGUE BID PRA, Disp: , Rfl:  .  ALPRAZolam (XANAX) 0.25 MG tablet, Take 0.25 mg by mouth 2 (two) times daily as needed., Disp: , Rfl:  .  bictegravir-emtricitabine-tenofovir AF (BIKTARVY) 50-200-25 MG TABS tablet, Take 1 tablet by mouth daily., Disp: 90  tablet, Rfl: 1 .  busPIRone (BUSPAR) 15 MG tablet, Take 15 mg by mouth 2 (two) times daily. (Patient not taking: Reported on 11/28/2019), Disp: , Rfl:    Review of Systems  Constitutional: Negative for activity change, appetite change, chills, diaphoresis, fatigue, fever and unexpected weight change.  HENT: Negative for congestion, rhinorrhea, sinus pressure, sneezing, sore throat and trouble swallowing.   Eyes: Negative for photophobia.  Respiratory: Negative for cough, chest tightness, shortness of breath, wheezing and stridor.   Cardiovascular: Negative for chest pain, palpitations and leg swelling.  Gastrointestinal: Negative for abdominal distention, abdominal pain, anal bleeding, blood in stool, constipation, diarrhea, nausea and vomiting.  Genitourinary: Negative for dysuria, flank pain and hematuria.  Musculoskeletal:  Negative for arthralgias, back pain, gait problem, joint swelling and myalgias.  Skin: Negative for color change, pallor, rash and wound.  Neurological: Negative for dizziness, tremors, weakness and light-headedness.  Hematological: Negative for adenopathy. Does not bruise/bleed easily.  Psychiatric/Behavioral: Negative for agitation, behavioral problems, confusion, decreased concentration, dysphoric mood, hallucinations and sleep disturbance. The patient is not nervous/anxious.        Objective:   Physical Exam Vitals and nursing note reviewed.  Constitutional:      Appearance: He is well-developed.  HENT:     Head: Normocephalic and atraumatic.  Eyes:     Conjunctiva/sclera: Conjunctivae normal.  Cardiovascular:     Rate and Rhythm: Normal rate and regular rhythm.  Pulmonary:     Effort: Pulmonary effort is normal. No respiratory distress.     Breath sounds: No wheezing.  Abdominal:     General: There is no distension.     Palpations: Abdomen is soft.  Musculoskeletal:        General: No tenderness or deformity. Normal range of motion.     Cervical back: Normal range of motion.  Skin:    General: Skin is warm and dry.     Coloration: Skin is not pale.     Findings: No erythema or rash.  Neurological:     Mental Status: He is alert and oriented to person, place, and time.  Psychiatric:        Mood and Affect: Mood normal.        Behavior: Behavior normal.        Thought Content: Thought content normal.        Judgment: Judgment normal.           Assessment & Plan:   HIV disease: Continue Biktarvy and can return to clinic in 1 years time  He is in a stable relationship with partner who is also HIV positive and well controlled on antiretrovirals.

## 2020-04-15 ENCOUNTER — Other Ambulatory Visit: Payer: Self-pay

## 2020-04-15 ENCOUNTER — Encounter: Payer: Self-pay | Admitting: Family Medicine

## 2020-04-15 ENCOUNTER — Ambulatory Visit (INDEPENDENT_AMBULATORY_CARE_PROVIDER_SITE_OTHER): Payer: 59 | Admitting: Family Medicine

## 2020-04-15 VITALS — BP 102/62 | HR 87 | Temp 98.8°F | Ht 71.5 in | Wt 145.4 lb

## 2020-04-15 DIAGNOSIS — B20 Human immunodeficiency virus [HIV] disease: Secondary | ICD-10-CM | POA: Diagnosis not present

## 2020-04-15 DIAGNOSIS — F439 Reaction to severe stress, unspecified: Secondary | ICD-10-CM

## 2020-04-15 NOTE — Progress Notes (Signed)
   Subjective:    Patient ID: Eddie Bruce, male    DOB: 05/17/1985, 35 y.o.   MRN: [id number]  HPI He is here to establish care.  He does have underlying HIV and is followed at the ID clinic by Dr. Daiva Eves.  He is in a 7-year relationship that he also has a business relationship with.  Apparently his partner is in and out of recovery from methadone.  He sometimes gets caught up in this.  He has had difficulty recently with stress, anxiety, sleep issues and some paranoid ideation.  He is not homicidal or suicidal.  This has been going on the last several months.  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  He recognizes the need for change and is willing to work on this.   Review of Systems     Objective:   Physical Exam Alert and in no distress.  PHQ score of 14. He gave me a verbal commitment to not do methadone.       Assessment & Plan:  HIV disease (HCC)  Stress at home Although he certainly has some stress related symptoms, at this point I do not feel the need to necessarily place him on an antidepressant.  He was comfortable with that.  Discussed the use of melatonin on a regular basis.  He will also get involved in counseling.  He will do this through the counseling services at the ID clinic.  I will recheck with him in 2 weeks to see how everything is going.

## 2020-04-22 ENCOUNTER — Ambulatory Visit (HOSPITAL_COMMUNITY)
Admission: EM | Admit: 2020-04-22 | Discharge: 2020-04-22 | Disposition: A | Discharge status: Home / Self Care | Payer: 59 | Attending: Nurse Practitioner | Admitting: Nurse Practitioner

## 2020-04-22 ENCOUNTER — Other Ambulatory Visit: Payer: Self-pay

## 2020-04-22 DIAGNOSIS — F29 Unspecified psychosis not due to a substance or known physiological condition: Secondary | ICD-10-CM | POA: Diagnosis not present

## 2020-04-22 MED ORDER — OLANZAPINE 5 MG PO TABS: 5.0000 mg | ORAL_TABLET | Freq: Every day | ORAL | 1 refills | Status: DC

## 2020-04-22 MED ORDER — OLANZAPINE 5 MG PO TBDP
5.0000 mg | ORAL_TABLET | Freq: Once | ORAL | Status: AC
  Administered 2020-04-22: 5 mg via ORAL
  Filled 2020-04-22: qty 1

## 2020-04-22 NOTE — ED Notes (Signed)
SW reviewing resources and prescriptions/follow up with patient.  AVS reviewed with patient and wife.  All belongings returned from unit locker.  Patient ambulated independently off unit.  Patient discharged in stable condition; no acute distress, accompanied by wife.
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
## 2020-04-22 NOTE — Discharge Instructions (Addendum)
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
## 2020-04-29 ENCOUNTER — Ambulatory Visit: Payer: 59 | Admitting: Family Medicine

## 2020-04-29 ENCOUNTER — Telehealth: Payer: Self-pay

## 2020-04-29 NOTE — Telephone Encounter (Signed)
Called the pt to find out if he was coming to his 10: 15 am appt. No answer LVM for pt to call back. KH

## 2020-05-06 ENCOUNTER — Encounter: Payer: Self-pay | Admitting: Family Medicine

## 2020-05-12 ENCOUNTER — Ambulatory Visit (INDEPENDENT_AMBULATORY_CARE_PROVIDER_SITE_OTHER): Payer: 59 | Admitting: Family Medicine

## 2020-05-12 ENCOUNTER — Other Ambulatory Visit: Payer: Self-pay

## 2020-05-12 ENCOUNTER — Encounter: Payer: Self-pay | Admitting: Family Medicine

## 2020-05-12 VITALS — BP 102/68 | HR 92 | Temp 98.6°F | Wt 141.4 lb

## 2020-05-12 DIAGNOSIS — R440 Auditory hallucinations: Secondary | ICD-10-CM | POA: Diagnosis not present

## 2020-05-12 DIAGNOSIS — F439 Reaction to severe stress, unspecified: Secondary | ICD-10-CM

## 2020-05-12 DIAGNOSIS — F5101 Primary insomnia: Secondary | ICD-10-CM

## 2020-05-12 MED ORDER — MIRTAZAPINE 7.5 MG PO TABS: 7.5000 mg | ORAL_TABLET | Freq: Every day | ORAL | 0 refills | Status: DC

## 2020-05-12 NOTE — Patient Instructions (Signed)
Set up an appointment for counseling and with his psychiatrist.  Call me tomorrow and let me know who you are seeing and when.  stay in the RV
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
## 2020-05-13 ENCOUNTER — Encounter (HOSPITAL_COMMUNITY): Payer: Self-pay | Admitting: *Deleted

## 2020-05-13 ENCOUNTER — Emergency Department (HOSPITAL_COMMUNITY): Payer: 59

## 2020-05-13 ENCOUNTER — Emergency Department (HOSPITAL_COMMUNITY)
Admission: EM | Admit: 2020-05-13 | Discharge: 2020-05-13 | Disposition: A | Discharge status: Home / Self Care | Payer: 59 | Attending: Emergency Medicine | Admitting: Emergency Medicine

## 2020-05-13 DIAGNOSIS — Z79899 Other long term (current) drug therapy: Secondary | ICD-10-CM | POA: Diagnosis not present

## 2020-05-13 DIAGNOSIS — R55 Syncope and collapse: Secondary | ICD-10-CM | POA: Diagnosis present

## 2020-05-13 DIAGNOSIS — R0789 Other chest pain: Secondary | ICD-10-CM | POA: Insufficient documentation

## 2020-05-13 DIAGNOSIS — Z21 Asymptomatic human immunodeficiency virus [HIV] infection status: Secondary | ICD-10-CM | POA: Diagnosis not present

## 2020-05-13 DIAGNOSIS — F17210 Nicotine dependence, cigarettes, uncomplicated: Secondary | ICD-10-CM | POA: Diagnosis not present

## 2020-05-13 DIAGNOSIS — R440 Auditory hallucinations: Secondary | ICD-10-CM | POA: Insufficient documentation

## 2020-05-13 LAB — URINALYSIS, ROUTINE W REFLEX MICROSCOPIC
Bacteria, UA: NONE SEEN
Bilirubin Urine: NEGATIVE
Glucose, UA: NEGATIVE mg/dL
Ketones, ur: 5 mg/dL — AB
Leukocytes,Ua: NEGATIVE
Nitrite: NEGATIVE
Protein, ur: NEGATIVE mg/dL
Specific Gravity, Urine: 1.003 — ABNORMAL LOW (ref 1.005–1.030)
pH: 6 (ref 5.0–8.0)

## 2020-05-13 LAB — BASIC METABOLIC PANEL
Anion gap: 10 (ref 5–15)
BUN: 13 mg/dL (ref 6–20)
CO2: 21 mmol/L — ABNORMAL LOW (ref 22–32)
Calcium: 9.5 mg/dL (ref 8.9–10.3)
Chloride: 108 mmol/L (ref 98–111)
Creatinine, Ser: 0.85 mg/dL (ref 0.61–1.24)
GFR, Estimated: >60 mL/min (ref >60)
Glucose, Bld: 106 mg/dL — ABNORMAL HIGH (ref 70–99)
Potassium: 3.9 mmol/L (ref 3.5–5.1)
Sodium: 139 mmol/L (ref 135–145)

## 2020-05-13 LAB — CBC
HCT: 40.9 % (ref 39.0–52.0)
Hemoglobin: 13.5 g/dL (ref 13.0–17.0)
MCH: 30.3 pg (ref 26.0–34.0)
MCHC: 33 g/dL (ref 30.0–36.0)
MCV: 91.9 fL (ref 80.0–100.0)
Platelets: 324 10*3/uL (ref 150–400)
RBC: 4.45 MIL/uL (ref 4.22–5.81)
RDW: 12.9 % (ref 11.5–15.5)
WBC: 8.4 10*3/uL (ref 4.0–10.5)
nRBC: 0 % (ref 0.0–0.2)

## 2020-05-13 LAB — TROPONIN I (HIGH SENSITIVITY)
Troponin I (High Sensitivity): <2 ng/L (ref <18)
Troponin I (High Sensitivity): <2 ng/L (ref <18)

## 2020-05-13 LAB — RAPID URINE DRUG SCREEN, HOSP PERFORMED
Amphetamines: NOT DETECTED
Barbiturates: NOT DETECTED
Benzodiazepines: NOT DETECTED
Cocaine: NOT DETECTED
Opiates: NOT DETECTED
Tetrahydrocannabinol: NOT DETECTED

## 2020-05-13 LAB — HEPATIC FUNCTION PANEL
ALT: 21 U/L (ref 0–44)
AST: 18 U/L (ref 15–41)
Albumin: 3.9 g/dL (ref 3.5–5.0)
Alkaline Phosphatase: 60 U/L (ref 38–126)
Bilirubin, Direct: <0.1 mg/dL (ref 0.0–0.2)
Total Bilirubin: 0.6 mg/dL (ref 0.3–1.2)
Total Protein: 7.2 g/dL (ref 6.5–8.1)

## 2020-05-13 LAB — ACETAMINOPHEN LEVEL: Acetaminophen (Tylenol), Serum: <10 ug/mL — ABNORMAL LOW (ref 10–30)

## 2020-05-13 LAB — ETHANOL: Alcohol, Ethyl (B): <10 mg/dL (ref <10)

## 2020-05-13 LAB — CBG MONITORING, ED: Glucose-Capillary: 97 mg/dL (ref 70–99)

## 2020-05-13 LAB — SALICYLATE LEVEL: Salicylate Lvl: <7 mg/dL — ABNORMAL LOW (ref 7.0–30.0)

## 2020-05-13 MED ORDER — ACETAMINOPHEN 500 MG PO TABS: 1000.0000 mg | ORAL_TABLET | Freq: Once | ORAL | Status: DC

## 2020-05-13 NOTE — Discharge Instructions (Signed)
Return for any problem.  ?

## 2020-05-13 NOTE — ED Provider Notes (Signed)
[facility]-EMERGENCY DEPT Provider Note   CSN: [id number] Arrival date & time: 05/13/20  1418     History Chief Complaint  Patient presents with  . Near Syncope    Khyler Eschmann is a 35 y.o. male.  35 year old male with prior medical history as detailed below presents for evaluation.  Patient complains of near syncopal event earlier today.  Patient reports that he felt like he was about to pass out.  Patient complains of vague anterior chest wall pain as well.  Patient's affect is flat.  He is not very forthcoming with details.  Patient does report auditory hallucinations.  He reports that he hears voices that are telling him that he is "worthless."  These voices have been present for the last 1 to 2 weeks and are getting more uncomfortable per his report.  The history is provided by the patient and medical records.  Near Syncope This is a new problem. The problem occurs rarely. The problem has not changed since onset.Associated symptoms include chest pain. Pertinent negatives include no abdominal pain, no headaches and no shortness of breath. Nothing aggravates the symptoms. Nothing relieves the symptoms.       Past Medical History:  Diagnosis Date  . HIV disease (HCC) 06/09/2015    Patient Active Problem List   Diagnosis Date Noted  . Unspecified psychosis not due to a substance or known physiological condition (HCC)   . Healthcare maintenance 03/31/2018  . Elevated liver enzymes 03/31/2018  . HIV disease (HCC) 06/09/2015    History reviewed. No pertinent surgical history.     No family history on file.  Social History   Tobacco Use  . Smoking status: Current Every Day Smoker    Packs/day: 0.25    Years: 15.00    Pack years: 3.75    Types: Cigarettes  . Smokeless tobacco: Never Used  . Tobacco comment: cutting back  Substance Use Topics  . Alcohol use: Yes    Alcohol/week: 0.0 standard drinks    Comment: rarely  . Drug use: No     Home Medications Prior to Admission medications   Medication Sig Start Date End Date Taking? Authorizing Provider  acetaminophen (TYLENOL) 500 MG tablet Take 500 mg by mouth every 6 (six) hours as needed.   Yes [provider]  ALPRAZolam (XANAX) 0.25 MG tablet Take 0.25 mg by mouth 2 (two) times daily as needed for sleep or anxiety. 06/26/18  Yes [provider]  bictegravir-emtricitabine-tenofovir AF (BIKTARVY) 50-200-25 MG TABS tablet Take 1 tablet by mouth daily. 11/28/19  Yes Daiva Eves, Lisette Grinder, MD  Docusate Sodium (STOOL SOFTENER) 100 MG capsule Take 100 mg by mouth 2 (two) times daily as needed for constipation.   Yes [provider]  mirtazapine (REMERON) 7.5 MG tablet Take 1 tablet (7.5 mg total) by mouth at bedtime. 05/12/20  Yes Ronnald Nian, MD  Pseudoeph-Doxylamine-DM-APAP (NYQUIL PO) Take 30 mLs by mouth at bedtime as needed (sleep).   Yes [provider]  OLANZapine (ZYPREXA) 5 MG tablet Take 1 tablet (5 mg total) by mouth at bedtime. Patient not taking: No sig reported 04/22/20   Jackelyn Poling, NP    Allergies    Patient has no known allergies.  Review of Systems   Review of Systems  Respiratory: Negative for shortness of breath.   Cardiovascular: Positive for chest pain and near-syncope.  Gastrointestinal: Negative for abdominal pain.  Neurological: Negative for headaches.  All other systems reviewed and are  negative.   Physical Exam Updated Vital Signs BP (!) 117/57   Pulse 65   Temp 98.7 F (37.1 C) (Oral)   Resp 17   Ht 5\' 11"  (1.803 m)   Wt 64.1 kg   SpO2 98%   BMI 19.72 kg/m   Physical Exam Vitals and nursing note reviewed.  Constitutional:      General: He is not in acute distress.    Appearance: Normal appearance. He is well-developed.  HENT:     Head: Normocephalic and atraumatic.  Eyes:     Conjunctiva/sclera: Conjunctivae normal.     Pupils: Pupils are equal, round, and reactive to light.   Cardiovascular:     Rate and Rhythm: Normal rate and regular rhythm.     Heart sounds: Normal heart sounds.  Pulmonary:     Effort: Pulmonary effort is normal. No respiratory distress.     Breath sounds: Normal breath sounds.  Abdominal:     General: There is no distension.     Palpations: Abdomen is soft.     Tenderness: There is no abdominal tenderness.  Musculoskeletal:        General: No deformity. Normal range of motion.     Cervical back: Normal range of motion and neck supple.  Skin:    General: Skin is warm and dry.  Neurological:     Mental Status: He is alert and oriented to person, place, and time.     ED Results / Procedures / Treatments   Labs (all labs ordered are listed, but only abnormal results are displayed) Labs Reviewed  BASIC METABOLIC PANEL - Abnormal; Notable for the following components:      Result Value   CO2 21 (*)    Glucose, Bld 106 (*)    All other components within normal limits  ACETAMINOPHEN LEVEL - Abnormal; Notable for the following components:   Acetaminophen (Tylenol), Serum <10 (*)    All other components within normal limits  SALICYLATE LEVEL - Abnormal; Notable for the following components:   Salicylate Lvl <7.0 (*)    All other components within normal limits  CBC  HEPATIC FUNCTION PANEL  ETHANOL  URINALYSIS, ROUTINE W REFLEX MICROSCOPIC  RAPID URINE DRUG SCREEN, HOSP PERFORMED  CBG MONITORING, ED  TROPONIN I (HIGH SENSITIVITY)  TROPONIN I (HIGH SENSITIVITY)    EKG EKG Interpretation  Date/Time:  Tuesday May 13 2020 14:40:00 EDT Ventricular Rate:  60 PR Interval:  155 QRS Duration: 87 QT Interval:  429 QTC Calculation: 429 R Axis:   57 Text Interpretation: Sinus rhythm ST elev, probable normal early repol pattern 12 Lead; Mason-Likar Confirmed by [id number] [phone]) on 05/13/2020 3:22:33 PM   Radiology DG Chest 2 View  Result Date: 05/13/2020 CLINICAL DATA:  Recent syncopal episode with shortness of breath EXAM:  CHEST - 2 VIEW COMPARISON:  None. FINDINGS: The heart size and mediastinal contours are within normal limits. Both lungs are clear. The visualized skeletal structures are unremarkable. IMPRESSION: No active cardiopulmonary disease. Electronically Signed   By: 07/13/2020 M.D.   On: 05/13/2020 16:13    Procedures Procedures   Medications Ordered in ED Medications - No data to display  ED Course  I have reviewed the triage vital signs and the nursing notes.  Pertinent labs & imaging results that were available during my care of the patient were reviewed by me and considered in my medical decision making (see chart for details).    MDM Rules/Calculators/A&P  MDM  MSE complete  Moe Graca was evaluated in Emergency Department on 05/13/2020 for the symptoms described in the history of present illness. He was evaluated in the context of the global COVID-19 pandemic, which necessitated consideration that the patient might be at risk for infection with the SARS-CoV-2 virus that causes COVID-19. Institutional protocols and algorithms that pertain to the evaluation of patients at risk for COVID-19 are in a state of rapid change based on information released by regulatory bodies including the CDC and federal and state organizations. These policies and algorithms were followed during the patient's care in the ED.   Patient is presenting with complaint of near syncopal event.  Patient also reports atypical chest discomfort.  Patient is additionally reporting auditory hallucinations.  Patient with EKG without evidence of acute ischemia.  Troponin x2 is undetectable.  Other screening labs are without significant abnormality.  Patient is medically clear at this time for further evaluation by psychiatry.  2300 Patient has not yet been evaluated by TTS.  Patient is accompanied by his partner at bedside. He does contract for safety. He declines to wait further for TTS eval  tonight.  He has already scheduled outpatient psychiatric evaluation for tomorrow.  Importance of close follow-up was stressed.  Strict return precautions given and understood.   Final Clinical Impression(s) / ED Diagnoses Final diagnoses:  Near syncope    Rx / DC Orders ED Discharge Orders    None       Wynetta Fines, MD 05/13/20 2310

## 2020-05-13 NOTE — ED Triage Notes (Addendum)
BIB EMS due to near syncopal episode, pt remains pale in color. ? Anxiety, decreased appetite for a few days. Hot flash then near syncope. 100/60-62- CBG 90  #18 IV L AC with 500 ml NS.  Seen at [facility] for A/H which he is still hearing.

## 2020-05-13 NOTE — ED Provider Notes (Signed)
Emergency Medicine Provider Triage Evaluation Note  Eddie Bruce , a 35 y.o. male  was evaluated in triage.  Pt complains of near syncope.  Patient states earlier today he felt like he was going to pass out.  When I am seeing patient, patient once again states he is feeling presyncopal.  He states he thinks he is going to pass out.  He is denying chest pain.  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  Admitted alcohol use, positive tobacco use.  Started a new medication, Remeron, yesterday.  Review of Systems  Positive: Pre-syncope Negative: cp  Physical Exam  BP 93/77 (BP Location: Left Arm)   Pulse 89   Temp 98.7 F (37.1 C) (Oral)   Resp 16   SpO2 99%  Gen:   Appears ill, pale and diaphoretic Resp:  Tachypneic MSK:   Moves extremities without difficulty  Other:  Mildly tachycardic.  No TTP of the abdomen.  Medical Decision Making  Medically screening exam initiated at 2:57 PM.  Appropriate orders placed.  Garrus Gauthreaux was informed that the remainder of the evaluation will be completed by another provider, this initial triage assessment does not replace that evaluation, and the importance of remaining in the ED until their evaluation is complete.  Patient appears ill, he is pale and diaphoretic.  Reporting presyncope.  Patient be roomed from triage.   Alveria Apley, PA-C 05/13/20 1458    Arby Barrette, MD 05/22/20 [phone]

## 2020-05-14 ENCOUNTER — Telehealth: Payer: Self-pay

## 2020-05-14 NOTE — Telephone Encounter (Signed)
LVM for pt to call back nad advise if he got schedule with a counselor. Per [employer] [facility]

## 2020-07-17 ENCOUNTER — Other Ambulatory Visit: Payer: Self-pay

## 2020-07-17 DIAGNOSIS — B20 Human immunodeficiency virus [HIV] disease: Secondary | ICD-10-CM

## 2020-07-17 MED ORDER — BIKTARVY 50-200-25 MG PO TABS: 1.0000 | ORAL_TABLET | Freq: Every day | ORAL | 2 refills | Status: DC

## 2020-11-04 ENCOUNTER — Other Ambulatory Visit: Payer: Self-pay

## 2020-11-04 ENCOUNTER — Other Ambulatory Visit (HOSPITAL_COMMUNITY)
Admission: RE | Admit: 2020-11-04 | Discharge: 2020-11-04 | Disposition: A | Discharge status: Home / Self Care | Payer: 59 | Source: Ambulatory Visit | Attending: Infectious Disease | Admitting: Infectious Disease

## 2020-11-04 ENCOUNTER — Other Ambulatory Visit: Payer: 59

## 2020-11-04 DIAGNOSIS — B20 Human immunodeficiency virus [HIV] disease: Secondary | ICD-10-CM | POA: Insufficient documentation

## 2020-11-05 LAB — T-HELPER CELL (CD4) - (RCID CLINIC ONLY)
CD4 % Helper T Cell: 35 % (ref 33–65)
CD4 T Cell Abs: 1003 /uL (ref 400–1790)

## 2020-11-05 LAB — URINE CYTOLOGY ANCILLARY ONLY
Chlamydia: NEGATIVE
Comment: NEGATIVE
Comment: NORMAL
Neisseria Gonorrhea: NEGATIVE

## 2020-11-06 LAB — CBC WITH DIFFERENTIAL/PLATELET
Absolute Monocytes: 370 cells/uL (ref 200–950)
Basophils Absolute: 28 cells/uL (ref 0–200)
Basophils Relative: 0.5 %
Eosinophils Absolute: 112 cells/uL (ref 15–500)
Eosinophils Relative: 2 %
HCT: 42.8 % (ref 38.5–50.0)
Hemoglobin: 14.6 g/dL (ref 13.2–17.1)
Lymphs Abs: 2946 cells/uL (ref 850–3900)
MCH: 31.1 pg (ref 27.0–33.0)
MCHC: 34.1 g/dL (ref 32.0–36.0)
MCV: 91.1 fL (ref 80.0–100.0)
MPV: 10.1 fL (ref 7.5–12.5)
Monocytes Relative: 6.6 %
Neutro Abs: 2145 cells/uL (ref 1500–7800)
Neutrophils Relative %: 38.3 %
Platelets: 280 10*3/uL (ref 140–400)
RBC: 4.7 10*6/uL (ref 4.20–5.80)
RDW: 13.1 % (ref 11.0–15.0)
Total Lymphocyte: 52.6 %
WBC: 5.6 10*3/uL (ref 3.8–10.8)

## 2020-11-06 LAB — COMPLETE METABOLIC PANEL WITH GFR
AG Ratio: 1.5 (calc) (ref 1.0–2.5)
ALT: 13 U/L (ref 9–46)
AST: 15 U/L (ref 10–40)
Albumin: 4.6 g/dL (ref 3.6–5.1)
Alkaline phosphatase (APISO): 63 U/L (ref 36–130)
BUN: 16 mg/dL (ref 7–25)
CO2: 23 mmol/L (ref 20–32)
Calcium: 9.9 mg/dL (ref 8.6–10.3)
Chloride: 105 mmol/L (ref 98–110)
Creat: 0.93 mg/dL (ref 0.60–1.26)
Globulin: 3.1 g/dL (calc) (ref 1.9–3.7)
Glucose, Bld: 85 mg/dL (ref 65–99)
Potassium: 4.7 mmol/L (ref 3.5–5.3)
Sodium: 137 mmol/L (ref 135–146)
Total Bilirubin: 0.3 mg/dL (ref 0.2–1.2)
Total Protein: 7.7 g/dL (ref 6.1–8.1)
eGFR: 110 mL/min/{1.73_m2} (ref >=60)

## 2020-11-06 LAB — HIV-1 RNA QUANT-NO REFLEX-BLD
HIV 1 RNA Quant: NOT DETECTED Copies/mL
HIV-1 RNA Quant, Log: NOT DETECTED Log cps/mL

## 2020-11-06 LAB — RPR: RPR Ser Ql: NONREACTIVE

## 2020-11-06 LAB — LIPID PANEL
Cholesterol: 155 mg/dL (ref <200)
HDL: 48 mg/dL (ref >=40)
LDL Cholesterol (Calc): 83 mg/dL (calc)
Non-HDL Cholesterol (Calc): 107 mg/dL (calc) (ref <130)
Total CHOL/HDL Ratio: 3.2 (calc) (ref <5.0)
Triglycerides: 139 mg/dL (ref <150)

## 2020-11-14 ENCOUNTER — Encounter: Payer: 59 | Admitting: Family Medicine

## 2020-11-18 ENCOUNTER — Encounter: Payer: 59 | Admitting: Infectious Disease

## 2020-12-12 ENCOUNTER — Encounter: Payer: Self-pay | Admitting: Infectious Disease

## 2020-12-12 ENCOUNTER — Other Ambulatory Visit: Payer: Self-pay

## 2020-12-12 ENCOUNTER — Ambulatory Visit (INDEPENDENT_AMBULATORY_CARE_PROVIDER_SITE_OTHER): Payer: 59

## 2020-12-12 ENCOUNTER — Ambulatory Visit (INDEPENDENT_AMBULATORY_CARE_PROVIDER_SITE_OTHER): Payer: 59 | Admitting: Infectious Disease

## 2020-12-12 VITALS — BP 138/91 | HR 109 | Temp 98.0°F | Wt 165.0 lb

## 2020-12-12 DIAGNOSIS — Z23 Encounter for immunization: Secondary | ICD-10-CM

## 2020-12-12 DIAGNOSIS — Z7185 Encounter for immunization safety counseling: Secondary | ICD-10-CM

## 2020-12-12 DIAGNOSIS — B20 Human immunodeficiency virus [HIV] disease: Secondary | ICD-10-CM | POA: Diagnosis not present

## 2020-12-12 MED ORDER — BIKTARVY 50-200-25 MG PO TABS: 1.0000 | ORAL_TABLET | Freq: Every day | ORAL | 11 refills | Status: DC

## 2020-12-12 NOTE — Progress Notes (Signed)
   Covid-19 Vaccination Clinic  Name:  Atif Chapple    MRN: [id number] DOB: 1985/02/21  12/12/2020  Mr. Plascencia was observed post Covid-19 immunization for 15 minutes without incident. He was provided with Vaccine Information Sheet and instruction to access the V-Safe system.   Mr. Endicott was instructed to call 911 with any severe reactions post vaccine: Difficulty breathing  Swelling of face and throat  A fast heartbeat  A bad rash all over body  Dizziness and weakness   Immunizations Administered     Name Date Dose VIS Date Route   Pfizer Covid-19 Vaccine Bivalent Booster 12/12/2020  3:48 PM 0.3 mL 09/03/2020 Intramuscular   Manufacturer: [employer], [employer]   Lot: [id number]   NDC: [address]-5     Juanita Laster, RMA

## 2020-12-12 NOTE — Progress Notes (Signed)
Subjective:   Chief complaint: Follow-up for HIV disease on medications   Patient ID: Eddie Bruce, male    DOB: 1985-10-24, 35 y.o.   MRN: [id number]  HPI   Eddie Bruce is a 35 year old Caucasian  man living with HIV that has been perfectly controlled on Biktarvy with viral load from this November undetectable.  He has moved to [place] but still comes to [place] frequently would like to continue to have care here.  I also do not think there is an infectious disease practice in [place].     Lab Results  Component Value Date   HIV1RNAQUANT Not Detected 11/04/2020   HIV1RNAQUANT 25 (H) 11/12/2019   HIV1RNAQUANT 56 (H) 10/26/2018   Lab Results  Component Value Date   CD4TABS 1,003 11/04/2020   CD4TABS 791 11/12/2019   CD4TABS 741 10/26/2018   He likes his Tivicay and Descovy and does not want to change meds.   Past Medical History:  Diagnosis Date   HIV disease (HCC) 06/09/2015    No past surgical history on file.  No family history on file.    Social History   Socioeconomic History   Marital status: Significant Other    Spouse name: Not on file   Number of children: Not on file   Years of education: Not on file   Highest education level: Not on file  Occupational History   Not on file  Tobacco Use   Smoking status: Every Day    Packs/day: 0.25    Years: 15.00    Pack years: 3.75    Types: Cigarettes   Smokeless tobacco: Never   Tobacco comments:    cutting back  Substance and Sexual Activity   Alcohol use: Yes    Alcohol/week: 0.0 standard drinks    Comment: rarely   Drug use: No   Sexual activity: Yes    Partners: Male    Birth control/protection: None    Comment: pt. declined condoms 10/2018  Other Topics Concern   Not on file  Social History Narrative   Not on file   Social Determinants of Health   Financial Resource Strain: Not on file  Food Insecurity: Not on file  Transportation Needs: Not on file  Physical Activity: Not on file   Stress: Not on file  Social Connections: Not on file    No Known Allergies   Current Outpatient Medications:    bictegravir-emtricitabine-tenofovir AF (BIKTARVY) 50-200-25 MG TABS tablet, Take 1 tablet by mouth daily., Disp: 90 tablet, Rfl: 2   acetaminophen (TYLENOL) 500 MG tablet, Take 500 mg by mouth every 6 (six) hours as needed., Disp: , Rfl:    ALPRAZolam (XANAX) 0.25 MG tablet, Take 0.25 mg by mouth 2 (two) times daily as needed for sleep or anxiety., Disp: , Rfl:    Docusate Sodium (STOOL SOFTENER) 100 MG capsule, Take 100 mg by mouth 2 (two) times daily as needed for constipation., Disp: , Rfl:    mirtazapine (REMERON) 7.5 MG tablet, Take 1 tablet (7.5 mg total) by mouth at bedtime., Disp: 20 tablet, Rfl: 0   OLANZapine (ZYPREXA) 5 MG tablet, Take 1 tablet (5 mg total) by mouth at bedtime. (Patient not taking: No sig reported), Disp: 30 tablet, Rfl: 1   Pseudoeph-Doxylamine-DM-APAP (NYQUIL PO), Take 30 mLs by mouth at bedtime as needed (sleep)., Disp: , Rfl:    Review of Systems  Constitutional:  Negative for activity change, appetite change, chills, diaphoresis, fatigue, fever and unexpected weight change.  HENT:  Negative for congestion, rhinorrhea, sinus pressure, sneezing, sore throat and trouble swallowing.   Eyes:  Negative for photophobia and visual disturbance.  Respiratory:  Negative for cough, chest tightness, shortness of breath, wheezing and stridor.   Cardiovascular:  Negative for chest pain, palpitations and leg swelling.  Gastrointestinal:  Negative for abdominal distention, abdominal pain, anal bleeding, blood in stool, constipation, diarrhea, nausea and vomiting.  Genitourinary:  Negative for difficulty urinating, dysuria, flank pain and hematuria.  Musculoskeletal:  Negative for arthralgias, back pain, gait problem, joint swelling and myalgias.  Skin:  Negative for color change, pallor, rash and wound.  Neurological:  Negative for dizziness, tremors, weakness  and light-headedness.  Hematological:  Negative for adenopathy. Does not bruise/bleed easily.  Psychiatric/Behavioral:  Negative for agitation, behavioral problems, confusion, decreased concentration, dysphoric mood and sleep disturbance.       Objective:   Physical Exam Constitutional:      Appearance: He is well-developed.  HENT:     Head: Normocephalic and atraumatic.  Eyes:     Conjunctiva/sclera: Conjunctivae normal.  Cardiovascular:     Rate and Rhythm: Normal rate and regular rhythm.  Pulmonary:     Effort: Pulmonary effort is normal. No respiratory distress.     Breath sounds: No wheezing.  Abdominal:     General: There is no distension.     Palpations: Abdomen is soft.  Musculoskeletal:        General: No tenderness. Normal range of motion.     Cervical back: Normal range of motion and neck supple.  Skin:    General: Skin is warm and dry.     Coloration: Skin is not pale.     Findings: No erythema or rash.  Neurological:     General: No focal deficit present.     Mental Status: He is alert and oriented to person, place, and time.  Psychiatric:        Mood and Affect: Mood normal.        Behavior: Behavior normal.        Thought Content: Thought content normal.        Judgment: Judgment normal.          Assessment & Plan:   HIV disease:  I reviewed his most recent viral load from November 1 which was not detected  Lab Results  Component Value Date   HIV1RNAQUANT Not Detected 11/04/2020     I reviewed his most recent CD4 count from some same date which was 1003.     Lab Results  Component Value Date   CD4TABS 1,003 11/04/2020   CD4TABS 791 11/12/2019   CD4TABS 741 10/26/2018    Vaccine counseling: Recommended COVID-19 updated booster as well as flu shot.  We gave him both of these today.

## 2021-01-05 ENCOUNTER — Encounter: Payer: Self-pay | Admitting: Infectious Disease

## 2021-01-06 ENCOUNTER — Other Ambulatory Visit: Payer: Self-pay

## 2021-01-06 DIAGNOSIS — B20 Human immunodeficiency virus [HIV] disease: Secondary | ICD-10-CM

## 2021-01-06 MED ORDER — BIKTARVY 50-200-25 MG PO TABS: 1.0000 | ORAL_TABLET | Freq: Every day | ORAL | 2 refills | Status: DC

## 2021-01-22 ENCOUNTER — Other Ambulatory Visit (HOSPITAL_COMMUNITY): Payer: Self-pay

## 2021-01-22 ENCOUNTER — Telehealth: Payer: Self-pay

## 2021-01-22 NOTE — Telephone Encounter (Signed)
[facility] Patient Advocate Encounter  Prior Authorization for [employer] has been approved.    PA# [id number] Effective dates: 01/20/21 through 01/20/22   [facility] will continue to follow.  Ileene Patrick, [facility] Patient [facility] for Infectious Disease Phone: [phone] Fax:  [phone]

## 2021-01-22 NOTE — Telephone Encounter (Signed)
Received fax from [employer] [facility] with PA for [employer]. PA is requesting additional information. Faxed last note along with diagnosis.  PA pending. Juanita Laster, RMA

## 2021-03-02 ENCOUNTER — Other Ambulatory Visit (HOSPITAL_COMMUNITY): Payer: Self-pay

## 2021-06-22 IMAGING — CR DG CHEST 2V
2 series · 2 of 2 positions shown · non-contrast
Comparison: None.

CLINICAL DATA: Recent syncopal episode with shortness of breath

EXAM:
CHEST - 2 VIEW

[w chest lat]
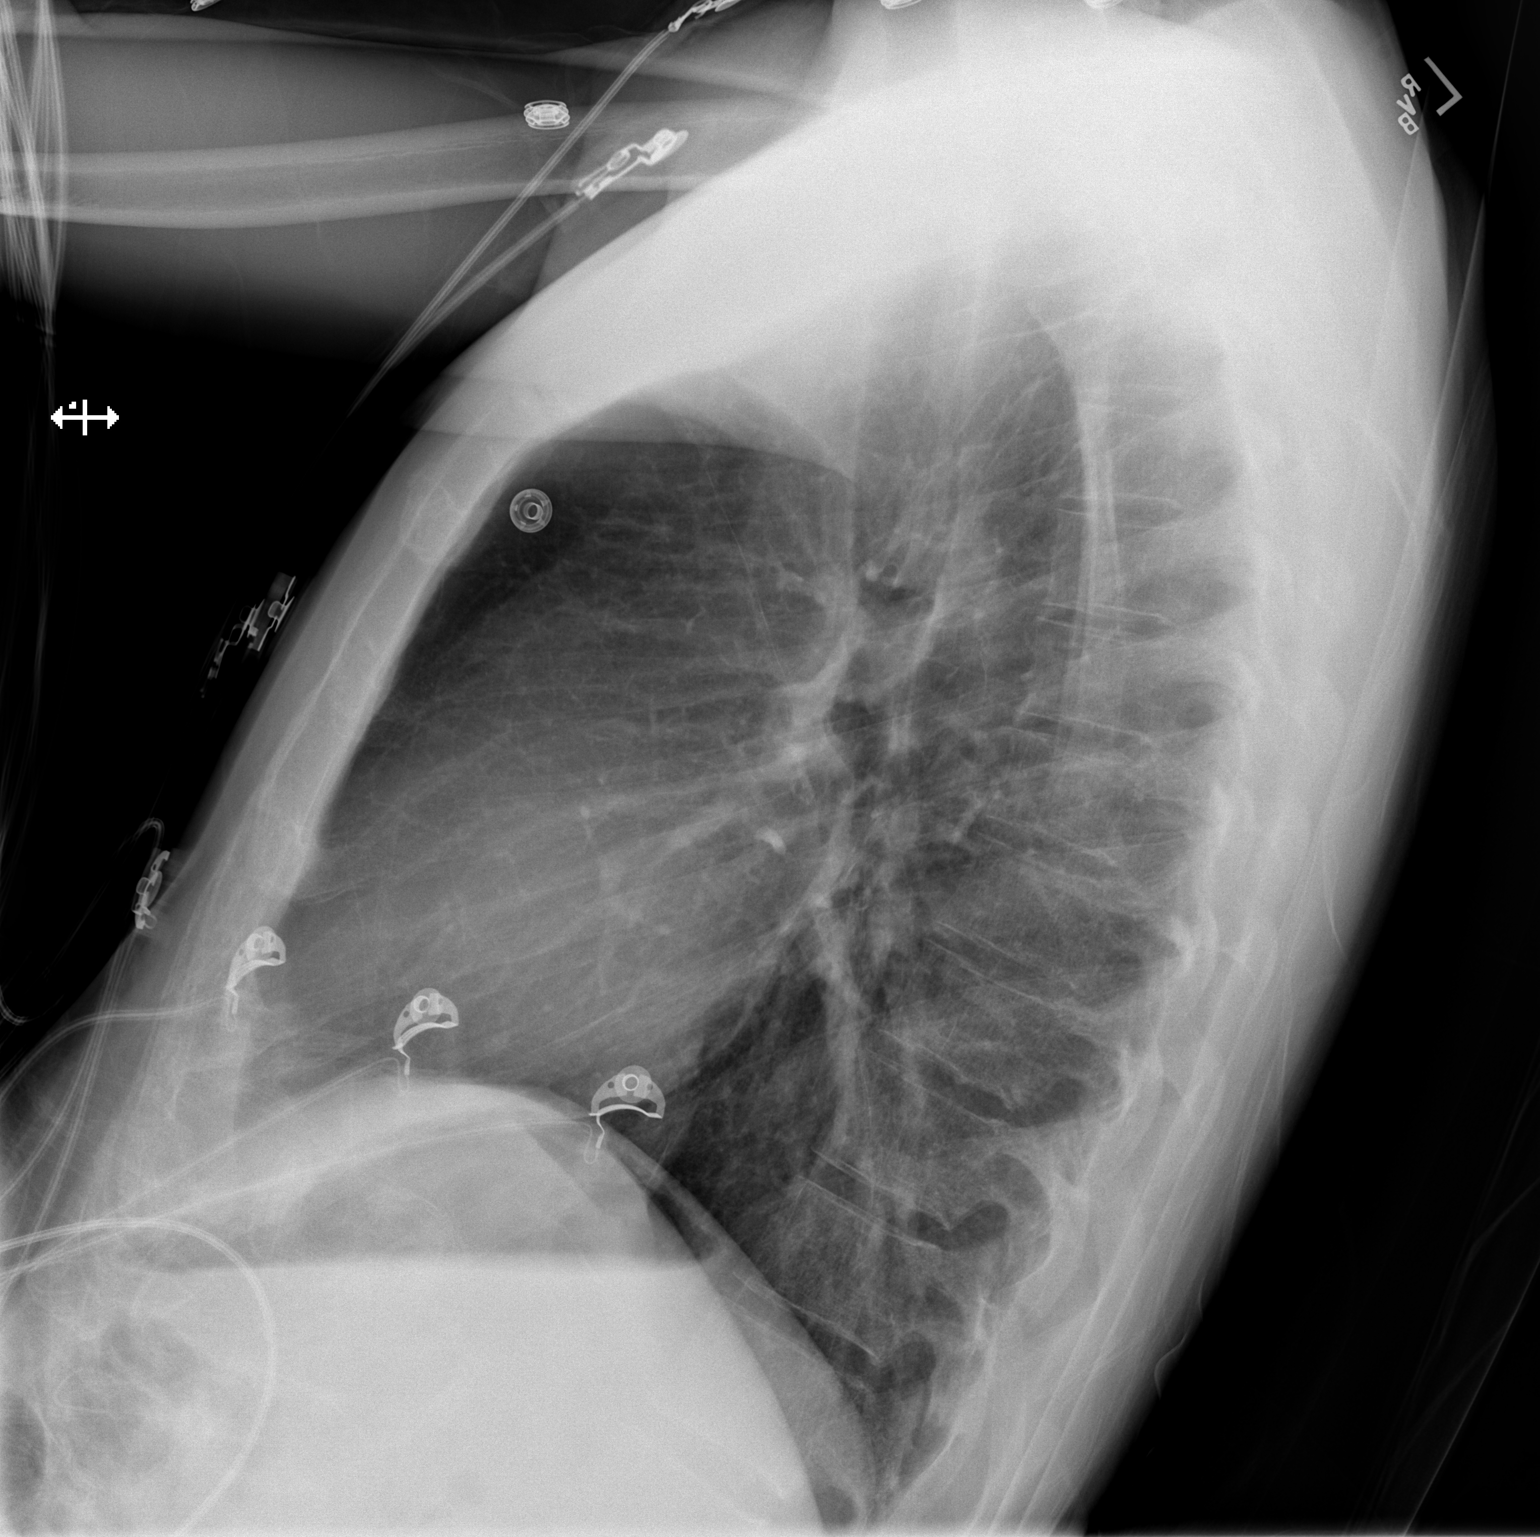

[x chest ap]
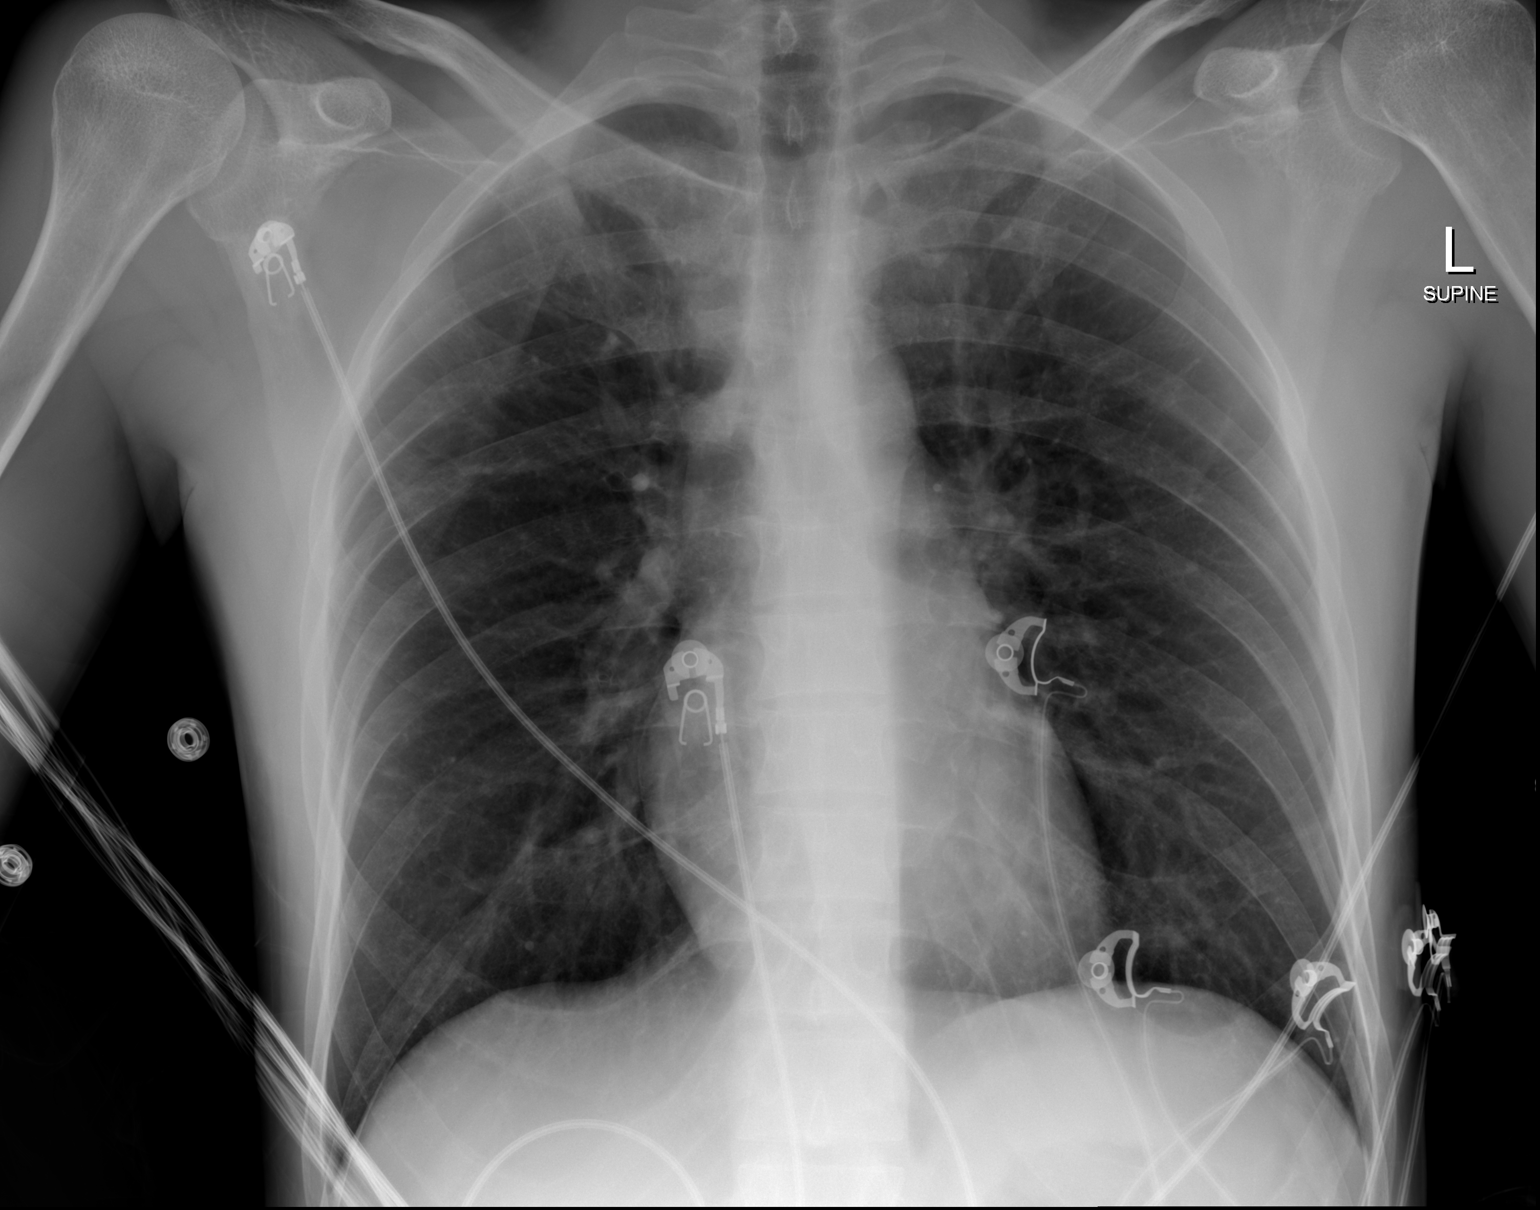

[2 of 2 positions shown; findings below may reference images not displayed]

FINDINGS: The heart size and mediastinal contours are within normal limits.
Both lungs are clear. The visualized skeletal structures are
unremarkable.
IMPRESSION: No active cardiopulmonary disease.

## 2021-08-31 ENCOUNTER — Other Ambulatory Visit (HOSPITAL_COMMUNITY): Payer: Self-pay

## 2021-09-01 ENCOUNTER — Telehealth: Payer: Self-pay

## 2021-09-01 ENCOUNTER — Other Ambulatory Visit (HOSPITAL_COMMUNITY): Payer: Self-pay

## 2021-09-01 NOTE — Telephone Encounter (Signed)
[facility] Patient Advocate Encounter   I was successful in securing patient a $7500.00 grant from [employer] ([employer]) to provide copayment coverage for Biktarvy.  This will make the out of pocket cost $0.00.     I have spoken with the patient.        Patient knows to call the office with questions or concerns.  Anishka Bushard, CPhT Specialty Pharmacy Patient [facility] Phone: [phone] Fax:  [phone]  

## 2021-09-15 ENCOUNTER — Other Ambulatory Visit: Payer: Self-pay | Admitting: Pharmacist

## 2021-09-15 DIAGNOSIS — B20 Human immunodeficiency virus [HIV] disease: Secondary | ICD-10-CM

## 2021-09-15 DIAGNOSIS — Z23 Encounter for immunization: Secondary | ICD-10-CM

## 2021-09-15 MED ORDER — BICTEGRAVIR-EMTRICITAB-TENOFOV 50-200-25 MG PO TABS: 1.0000 | ORAL_TABLET | Freq: Every day | ORAL | 0 refills | Status: DC

## 2021-09-15 NOTE — Progress Notes (Signed)
Medication Samples have been provided to the patient.  Drug name: Biktarvy        Strength: 50/200/25 mg       Qty: 7 tablets (1 bottles) LOT: [id number]   Exp.Date: 9/25  Dosing instructions: Take one tablet by mouth once daily  The patient has been instructed regarding the correct time, dose, and frequency of taking this medication, including desired effects and most common side effects.   Taylor Spilde, PharmD, CPP, BCIDP Clinical Pharmacist Practitioner Infectious Diseases Clinical Pharmacist [facility]  

## 2021-11-07 ENCOUNTER — Other Ambulatory Visit: Payer: Self-pay | Admitting: Infectious Disease

## 2021-11-07 DIAGNOSIS — B20 Human immunodeficiency virus [HIV] disease: Secondary | ICD-10-CM

## 2021-11-09 ENCOUNTER — Other Ambulatory Visit: Payer: Self-pay

## 2021-12-03 ENCOUNTER — Other Ambulatory Visit: Payer: Self-pay

## 2021-12-03 ENCOUNTER — Encounter: Payer: Self-pay | Admitting: Internal Medicine

## 2021-12-03 ENCOUNTER — Ambulatory Visit (INDEPENDENT_AMBULATORY_CARE_PROVIDER_SITE_OTHER): Payer: Self-pay | Admitting: Internal Medicine

## 2021-12-03 DIAGNOSIS — U071 COVID-19: Secondary | ICD-10-CM

## 2021-12-03 NOTE — Progress Notes (Signed)
ID PHONE NOTE:   Patient evaluated today via phone visit as a work in.  He was diagnosed with COVID today and reports mild symptoms of congestion, low grade fever, and body aches.  Otherwise he is feeling okay and wanted to check in to see if he should be doing anything besides supportive care, specifically if he should take Paxlovid.   Discussed that we could send in an Rx for paxlovid as no major interaction with Biktarvy and only adverse effect potentially could be rebound symptoms following Paxlovid treatment.  Given that he is otherwise a very healthy 36 year old with well controlled HIV, I told him I thought it is very reasonable to forego Paxlovid at this time and just do supportive care.  He will let us know if he has any changes in his symptoms.   Vedia Coffer for Infectious Disease [facility] 12/03/2021, 3:13 PM   NO CHARGE.

## 2021-12-23 ENCOUNTER — Ambulatory Visit (INDEPENDENT_AMBULATORY_CARE_PROVIDER_SITE_OTHER): Payer: 59 | Admitting: Infectious Disease

## 2021-12-23 ENCOUNTER — Encounter: Payer: Self-pay | Admitting: Infectious Disease

## 2021-12-23 ENCOUNTER — Other Ambulatory Visit: Payer: Self-pay

## 2021-12-23 ENCOUNTER — Other Ambulatory Visit (HOSPITAL_COMMUNITY)
Admission: RE | Admit: 2021-12-23 | Discharge: 2021-12-23 | Disposition: A | Discharge status: Home / Self Care | Payer: 59 | Source: Ambulatory Visit | Attending: Infectious Disease | Admitting: Infectious Disease

## 2021-12-23 VITALS — BP 143/88 | HR 100 | Temp 97.8°F | Ht 72.0 in | Wt 173.0 lb

## 2021-12-23 DIAGNOSIS — Z8659 Personal history of other mental and behavioral disorders: Secondary | ICD-10-CM

## 2021-12-23 DIAGNOSIS — R69 Illness, unspecified: Secondary | ICD-10-CM | POA: Diagnosis not present

## 2021-12-23 DIAGNOSIS — Z23 Encounter for immunization: Secondary | ICD-10-CM | POA: Diagnosis not present

## 2021-12-23 DIAGNOSIS — B20 Human immunodeficiency virus [HIV] disease: Secondary | ICD-10-CM | POA: Diagnosis present

## 2021-12-23 DIAGNOSIS — Z7185 Encounter for immunization safety counseling: Secondary | ICD-10-CM

## 2021-12-23 DIAGNOSIS — U071 COVID-19: Secondary | ICD-10-CM

## 2021-12-23 HISTORY — DX: Encounter for immunization safety counseling: Z71.85

## 2021-12-23 MED ORDER — BIKTARVY 50-200-25 MG PO TABS: 1.0000 | ORAL_TABLET | Freq: Every day | ORAL | 11 refills | Status: DC

## 2021-12-23 NOTE — Progress Notes (Signed)
Subjective:   Chief complaint: Follow-up for HIV disease on medications   Patient ID: Eddie Bruce, male    DOB: 28-Feb-1985, 36 y.o.   MRN: [id number]  HPI   Eddie Bruce is a 36 year old Caucasian  man living with HIV that has been perfectly controlled on Biktarvy with viral load from this November undetectable.  He has moved to [place] but still comes to [place] frequently would like to continue to have care here.   Here for his annual follow-up.  We did discuss the embrace study but he feels to be too many visits for him to make it worthwhile from [place].  We also talked about Cabenuva but he did not want to take the risk of 1% virological failure with on-time injections.  He had recent COVID-19 infection and did a telephone visit with my partner Dr. Earlene Plater.  They elected not to proceed with Paxlovid and he recovered without antivirals.      Lab Results  Component Value Date   HIV1RNAQUANT Not Detected 11/04/2020   HIV1RNAQUANT 25 (H) 11/12/2019   HIV1RNAQUANT 56 (H) 10/26/2018   Lab Results  Component Value Date   CD4TABS 1,003 11/04/2020   CD4TABS 791 11/12/2019   CD4TABS 741 10/26/2018   He likes his Tivicay and Descovy and does not want to change meds.   Past Medical History:  Diagnosis Date   HIV disease (HCC) 06/09/2015    No past surgical history on file.  No family history on file.    Social History   Socioeconomic History   Marital status: Married    Spouse name: Not on file   Number of children: Not on file   Years of education: Not on file   Highest education level: Not on file  Occupational History   Not on file  Tobacco Use   Smoking status: Every Day    Packs/day: 0.25    Years: 15.00    Total pack years: 3.75    Types: Cigarettes   Smokeless tobacco: Never   Tobacco comments:    cutting back  Substance and Sexual Activity   Alcohol use: Yes    Alcohol/week: 0.0 standard drinks of alcohol    Comment: rarely   Drug use: No    Sexual activity: Yes    Partners: Male    Birth control/protection: None    Comment: pt. declined condoms 10/2018  Other Topics Concern   Not on file  Social History Narrative   Not on file   Social Determinants of Health   Financial Resource Strain: Not on file  Food Insecurity: Not on file  Transportation Needs: Not on file  Physical Activity: Not on file  Stress: Not on file  Social Connections: Not on file    No Known Allergies   Current Outpatient Medications:    BIKTARVY 50-200-25 MG TABS tablet, Take 1 tablet by mouth once daily, Disp: 90 tablet, Rfl: 0   acetaminophen (TYLENOL) 500 MG tablet, Take 500 mg by mouth every 6 (six) hours as needed., Disp: , Rfl:    ALPRAZolam (XANAX) 0.25 MG tablet, Take 0.25 mg by mouth 2 (two) times daily as needed for sleep or anxiety., Disp: , Rfl:    Docusate Sodium (STOOL SOFTENER) 100 MG capsule, Take 100 mg by mouth 2 (two) times daily as needed for constipation., Disp: , Rfl:    mirtazapine (REMERON) 7.5 MG tablet, Take 1 tablet (7.5 mg total) by mouth at bedtime., Disp: 20 tablet, Rfl: 0   OLANZapine (  ZYPREXA) 5 MG tablet, Take 1 tablet (5 mg total) by mouth at bedtime. (Patient not taking: No sig reported), Disp: 30 tablet, Rfl: 1   Pseudoeph-Doxylamine-DM-APAP (NYQUIL PO), Take 30 mLs by mouth at bedtime as needed (sleep)., Disp: , Rfl:    Review of Systems  Constitutional:  Negative for activity change, appetite change, chills, diaphoresis, fatigue, fever and unexpected weight change.  HENT:  Negative for congestion, rhinorrhea, sinus pressure, sneezing, sore throat and trouble swallowing.   Eyes:  Negative for photophobia and visual disturbance.  Respiratory:  Negative for cough, chest tightness, shortness of breath, wheezing and stridor.   Cardiovascular:  Negative for chest pain, palpitations and leg swelling.  Gastrointestinal:  Negative for abdominal distention, abdominal pain, anal bleeding, blood in stool, constipation,  diarrhea, nausea and vomiting.  Genitourinary:  Negative for difficulty urinating, dysuria, flank pain and hematuria.  Musculoskeletal:  Negative for arthralgias, back pain, gait problem, joint swelling and myalgias.  Skin:  Negative for color change, pallor, rash and wound.  Neurological:  Negative for dizziness, tremors, weakness and light-headedness.  Hematological:  Negative for adenopathy. Does not bruise/bleed easily.  Psychiatric/Behavioral:  Negative for agitation, behavioral problems, confusion, decreased concentration, dysphoric mood and sleep disturbance.        Objective:   Physical Exam Constitutional:      Appearance: He is well-developed.  HENT:     Head: Normocephalic and atraumatic.  Eyes:     Conjunctiva/sclera: Conjunctivae normal.  Cardiovascular:     Rate and Rhythm: Normal rate and regular rhythm.  Pulmonary:     Effort: Pulmonary effort is normal. No respiratory distress.     Breath sounds: No wheezing.  Abdominal:     General: There is no distension.     Palpations: Abdomen is soft.  Musculoskeletal:        General: No tenderness. Normal range of motion.     Cervical back: Normal range of motion and neck supple.  Skin:    General: Skin is warm and dry.     Coloration: Skin is not pale.     Findings: No erythema or rash.  Neurological:     General: No focal deficit present.     Mental Status: He is alert and oriented to person, place, and time.  Psychiatric:        Mood and Affect: Mood normal.        Behavior: Behavior normal.        Thought Content: Thought content normal.        Judgment: Judgment normal.           Assessment & Plan:    HIV disease:  I will add order HIV viral load CD4 count CBC with differential CMP, RPR GC and chlamydia and I will continue  [employer], prescription  Recent COVID 19 infection: recovered  Problems with depression last year: resolved   Vaccine counseling: Flu shot which she received  today.

## 2021-12-24 LAB — URINE CYTOLOGY ANCILLARY ONLY
Chlamydia: NEGATIVE
Comment: NEGATIVE
Comment: NORMAL
Neisseria Gonorrhea: NEGATIVE

## 2021-12-24 LAB — T-HELPER CELLS (CD4) COUNT (NOT AT ARMC)
CD4 % Helper T Cell: 39 % (ref 33–65)
CD4 T Cell Abs: 1071 /uL (ref 400–1790)

## 2021-12-25 LAB — LIPID PANEL
Cholesterol: 193 mg/dL (ref <200)
HDL: 40 mg/dL (ref >=40)
LDL Cholesterol (Calc): 104 mg/dL (calc) — ABNORMAL HIGH
Non-HDL Cholesterol (Calc): 153 mg/dL (calc) — ABNORMAL HIGH (ref <130)
Total CHOL/HDL Ratio: 4.8 (calc) (ref <5.0)
Triglycerides: 370 mg/dL — ABNORMAL HIGH (ref <150)

## 2021-12-25 LAB — CBC WITH DIFFERENTIAL/PLATELET
Absolute Monocytes: 442 cells/uL (ref 200–950)
Basophils Absolute: 27 cells/uL (ref 0–200)
Basophils Relative: 0.4 %
Eosinophils Absolute: 80 cells/uL (ref 15–500)
Eosinophils Relative: 1.2 %
HCT: 43.7 % (ref 38.5–50.0)
Hemoglobin: 15.1 g/dL (ref 13.2–17.1)
Lymphs Abs: 2935 cells/uL (ref 850–3900)
MCH: 31.9 pg (ref 27.0–33.0)
MCHC: 34.6 g/dL (ref 32.0–36.0)
MCV: 92.2 fL (ref 80.0–100.0)
MPV: 10.2 fL (ref 7.5–12.5)
Monocytes Relative: 6.6 %
Neutro Abs: 3216 cells/uL (ref 1500–7800)
Neutrophils Relative %: 48 %
Platelets: 323 10*3/uL (ref 140–400)
RBC: 4.74 10*6/uL (ref 4.20–5.80)
RDW: 11.9 % (ref 11.0–15.0)
Total Lymphocyte: 43.8 %
WBC: 6.7 10*3/uL (ref 3.8–10.8)

## 2021-12-25 LAB — COMPLETE METABOLIC PANEL WITH GFR
AG Ratio: 1.4 (calc) (ref 1.0–2.5)
ALT: 39 U/L (ref 9–46)
AST: 23 U/L (ref 10–40)
Albumin: 4.7 g/dL (ref 3.6–5.1)
Alkaline phosphatase (APISO): 94 U/L (ref 36–130)
BUN: 17 mg/dL (ref 7–25)
CO2: 26 mmol/L (ref 20–32)
Calcium: 10.2 mg/dL (ref 8.6–10.3)
Chloride: 104 mmol/L (ref 98–110)
Creat: 1.09 mg/dL (ref 0.60–1.26)
Globulin: 3.4 g/dL (calc) (ref 1.9–3.7)
Glucose, Bld: 81 mg/dL (ref 65–99)
Potassium: 4 mmol/L (ref 3.5–5.3)
Sodium: 140 mmol/L (ref 135–146)
Total Bilirubin: 0.4 mg/dL (ref 0.2–1.2)
Total Protein: 8.1 g/dL (ref 6.1–8.1)
eGFR: 90 mL/min/{1.73_m2} (ref >=60)

## 2021-12-25 LAB — RPR: RPR Ser Ql: NONREACTIVE

## 2021-12-25 LAB — HIV-1 RNA QUANT-NO REFLEX-BLD
HIV 1 RNA Quant: NOT DETECTED Copies/mL
HIV-1 RNA Quant, Log: NOT DETECTED Log cps/mL

## 2022-02-19 ENCOUNTER — Other Ambulatory Visit (HOSPITAL_COMMUNITY): Payer: Self-pay

## 2022-02-23 ENCOUNTER — Other Ambulatory Visit: Payer: Self-pay | Admitting: Pharmacist

## 2022-02-23 DIAGNOSIS — B20 Human immunodeficiency virus [HIV] disease: Secondary | ICD-10-CM

## 2022-02-23 MED ORDER — BICTEGRAVIR-EMTRICITAB-TENOFOV 50-200-25 MG PO TABS: 1.0000 | ORAL_TABLET | Freq: Every day | ORAL | 0 refills | Status: DC

## 2022-02-23 NOTE — Progress Notes (Signed)
Medication Samples have been provided to the patient.  Drug name: Biktarvy        Strength: 50/200/25 mg       Qty: 7 tablets (1 bottles) LOT: CPBDCA   Exp.Date: 3/26  Dosing instructions: Take one tablet by mouth once daily  The patient has been instructed regarding the correct time, dose, and frequency of taking this medication, including desired effects and most common side effects.   Alfonse Spruce, PharmD, CPP, BCIDP, [place] Clinical Pharmacist Practitioner [facility]

## 2022-03-05 ENCOUNTER — Other Ambulatory Visit (HOSPITAL_COMMUNITY): Payer: Self-pay

## 2022-03-05 ENCOUNTER — Other Ambulatory Visit: Payer: Self-pay

## 2022-03-05 DIAGNOSIS — B20 Human immunodeficiency virus [HIV] disease: Secondary | ICD-10-CM

## 2022-03-05 MED ORDER — BIKTARVY 50-200-25 MG PO TABS: 1.0000 | ORAL_TABLET | Freq: Every day | ORAL | 3 refills | Status: DC

## 2022-12-01 ENCOUNTER — Other Ambulatory Visit: Payer: Self-pay

## 2022-12-01 ENCOUNTER — Encounter: Payer: Self-pay | Admitting: Infectious Disease

## 2022-12-01 ENCOUNTER — Ambulatory Visit (INDEPENDENT_AMBULATORY_CARE_PROVIDER_SITE_OTHER): Payer: BLUE CROSS/BLUE SHIELD | Admitting: Infectious Disease

## 2022-12-01 VITALS — BP 151/96 | HR 101 | Temp 98.1°F | Wt 177.4 lb

## 2022-12-01 DIAGNOSIS — F17200 Nicotine dependence, unspecified, uncomplicated: Secondary | ICD-10-CM | POA: Diagnosis not present

## 2022-12-01 DIAGNOSIS — Z7185 Encounter for immunization safety counseling: Secondary | ICD-10-CM | POA: Diagnosis not present

## 2022-12-01 DIAGNOSIS — Z23 Encounter for immunization: Secondary | ICD-10-CM | POA: Diagnosis not present

## 2022-12-01 DIAGNOSIS — B20 Human immunodeficiency virus [HIV] disease: Secondary | ICD-10-CM

## 2022-12-01 MED ORDER — VARENICLINE TARTRATE (STARTER) 0.5 MG X 11 & 1 MG X 42 PO TBPK: ORAL_TABLET | ORAL | 0 refills | Status: DC

## 2022-12-01 MED ORDER — VARENICLINE TARTRATE 1 MG PO TABS: 1.0000 mg | ORAL_TABLET | Freq: Two times a day (BID) | ORAL | 1 refills | Status: DC

## 2022-12-01 MED ORDER — BIKTARVY 50-200-25 MG PO TABS: 1.0000 | ORAL_TABLET | Freq: Every day | ORAL | 3 refills | Status: DC
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
## 2022-12-03 LAB — C. TRACHOMATIS/N. GONORRHOEAE RNA
C. trachomatis RNA, TMA: NOT DETECTED
N. gonorrhoeae RNA, TMA: NOT DETECTED

## 2022-12-05 LAB — COMPLETE METABOLIC PANEL WITH GFR
AG Ratio: 1.6 (calc) (ref 1.0–2.5)
ALT: 55 U/L — ABNORMAL HIGH (ref 9–46)
AST: 39 U/L (ref 10–40)
Albumin: 4.9 g/dL (ref 3.6–5.1)
Alkaline phosphatase (APISO): 98 U/L (ref 36–130)
BUN: 12 mg/dL (ref 7–25)
CO2: 25 mmol/L (ref 20–32)
Calcium: 10.2 mg/dL (ref 8.6–10.3)
Chloride: 102 mmol/L (ref 98–110)
Creat: 1.01 mg/dL (ref 0.60–1.26)
Globulin: 3.1 g/dL (ref 1.9–3.7)
Glucose, Bld: 96 mg/dL (ref 65–99)
Potassium: 4 mmol/L (ref 3.5–5.3)
Sodium: 138 mmol/L (ref 135–146)
Total Bilirubin: 0.5 mg/dL (ref 0.2–1.2)
Total Protein: 8 g/dL (ref 6.1–8.1)
eGFR: 98 mL/min/{1.73_m2} (ref >=60)

## 2022-12-05 LAB — T-HELPER CELLS (CD4) COUNT (NOT AT ARMC)
Absolute CD4: 1287 {cells}/uL (ref 490–1740)
CD4 T Helper %: 45 % (ref 30–61)
Total lymphocyte count: 2884 {cells}/uL (ref 850–3900)

## 2022-12-05 LAB — CBC WITH DIFFERENTIAL/PLATELET
Absolute Lymphocytes: 2513 {cells}/uL (ref 850–3900)
Absolute Monocytes: 511 {cells}/uL (ref 200–950)
Basophils Absolute: 43 {cells}/uL (ref 0–200)
Basophils Relative: 0.6 %
Eosinophils Absolute: 122 {cells}/uL (ref 15–500)
Eosinophils Relative: 1.7 %
HCT: 45.4 % (ref 38.5–50.0)
Hemoglobin: 15.6 g/dL (ref 13.2–17.1)
MCH: 31.9 pg (ref 27.0–33.0)
MCHC: 34.4 g/dL (ref 32.0–36.0)
MCV: 92.8 fL (ref 80.0–100.0)
MPV: 9.8 fL (ref 7.5–12.5)
Monocytes Relative: 7.1 %
Neutro Abs: 4010 {cells}/uL (ref 1500–7800)
Neutrophils Relative %: 55.7 %
Platelets: 290 10*3/uL (ref 140–400)
RBC: 4.89 10*6/uL (ref 4.20–5.80)
RDW: 12.3 % (ref 11.0–15.0)
Total Lymphocyte: 34.9 %
WBC: 7.2 10*3/uL (ref 3.8–10.8)

## 2022-12-05 LAB — LIPID PANEL
Cholesterol: 168 mg/dL (ref <200)
HDL: 38 mg/dL — ABNORMAL LOW (ref >=40)
LDL Cholesterol (Calc): 90 mg/dL
Non-HDL Cholesterol (Calc): 130 mg/dL — ABNORMAL HIGH (ref <130)
Total CHOL/HDL Ratio: 4.4 (calc) (ref <5.0)
Triglycerides: 299 mg/dL — ABNORMAL HIGH (ref <150)

## 2022-12-05 LAB — HIV-1 RNA QUANT-NO REFLEX-BLD
HIV 1 RNA Quant: NOT DETECTED {copies}/mL
HIV-1 RNA Quant, Log: NOT DETECTED {Log_copies}/mL

## 2022-12-05 LAB — RPR: RPR Ser Ql: NONREACTIVE

## 2022-12-06 LAB — CYTOLOGY - NON PAP

## 2022-12-22 ENCOUNTER — Ambulatory Visit: Payer: 59 | Admitting: Internal Medicine

## 2023-01-06 ENCOUNTER — Other Ambulatory Visit: Payer: Self-pay | Admitting: Infectious Disease

## 2023-02-06 ENCOUNTER — Other Ambulatory Visit: Payer: Self-pay | Admitting: Infectious Disease

## 2023-02-07 NOTE — Telephone Encounter (Signed)
 [facility] to refill

## 2023-04-23 ENCOUNTER — Other Ambulatory Visit: Payer: Self-pay | Admitting: Infectious Disease

## 2023-04-25 NOTE — Telephone Encounter (Signed)
 Okay to refill? How many refills?

## 2023-07-19 ENCOUNTER — Encounter: Payer: Self-pay | Admitting: Infectious Disease

## 2023-10-11 NOTE — Progress Notes (Unsigned)
   Subjective:  Chief complaint: follow-up for HIV disease on medications   Patient ID: Eddie Bruce, male    DOB: [birth year], 38 y.o.   MRN: [id number]  HPI  Past Medical History:  Diagnosis Date   HIV disease (HCC) 06/09/2015   Vaccine counseling 12/23/2021    No past surgical history on file.  No family history on file.    Social History   Socioeconomic History   Marital status: Married    Spouse name: Not on file   Number of children: Not on file   Years of education: Not on file   Highest education level: Not on file  Occupational History   Not on file  Tobacco Use   Smoking status: Every Day    Current packs/day: 0.25    Average packs/day: 0.3 packs/day for 15.0 years (3.8 ttl pk-yrs)    Types: Cigarettes   Smokeless tobacco: Never   Tobacco comments:    cutting back  Substance and Sexual Activity   Alcohol use: Yes    Alcohol/week: 0.0 standard drinks of alcohol    Comment: rarely   Drug use: No   Sexual activity: Yes    Partners: Male    Birth control/protection: None    Comment: pt. declined condoms 10/2018  Other Topics Concern   Not on file  Social History Narrative   Not on file   Social Drivers of Health   Financial Resource Strain: Not on file  Food Insecurity: Not on file  Transportation Needs: Not on file  Physical Activity: Not on file  Stress: Not on file  Social Connections: Not on file    No Known Allergies   Current Outpatient Medications:    bictegravir-emtricitabine -tenofovir  AF (BIKTARVY ) 50-200-25 MG TABS tablet, Take 1 tablet by mouth daily., Disp: 90 tablet, Rfl: 3   varenicline  (CHANTIX ) 1 MG tablet, Take 1 tablet by mouth twice daily. Start after finishing starter pack., Disp: 60 tablet, Rfl: 2   Varenicline  Tartrate, Starter, (CHANTIX  STARTING MONTH PAK) 0.5 MG X 11 & 1 MG X 42 TBPK, 0.5 mg tab qdaily x 3 days, then increase  0.5 mg tab BID x 4 days, then 1 mg tablet BID, Disp: 42 each, Rfl: 0    Review of Systems      Objective:   Physical Exam        Assessment & Plan:

## 2023-10-12 ENCOUNTER — Encounter: Payer: Self-pay | Admitting: Infectious Disease

## 2023-10-12 ENCOUNTER — Other Ambulatory Visit: Payer: Self-pay

## 2023-10-12 ENCOUNTER — Ambulatory Visit: Payer: BLUE CROSS/BLUE SHIELD | Admitting: Infectious Disease

## 2023-10-12 ENCOUNTER — Other Ambulatory Visit (HOSPITAL_COMMUNITY)
Admission: RE | Admit: 2023-10-12 | Discharge: 2023-10-12 | Disposition: A | Discharge status: Home / Self Care | Source: Ambulatory Visit | Attending: Infectious Disease | Admitting: Infectious Disease

## 2023-10-12 VITALS — BP 131/87 | HR 87 | Temp 98.0°F | Ht 71.0 in | Wt 176.0 lb

## 2023-10-12 DIAGNOSIS — E785 Hyperlipidemia, unspecified: Secondary | ICD-10-CM

## 2023-10-12 DIAGNOSIS — Z7185 Encounter for immunization safety counseling: Secondary | ICD-10-CM | POA: Diagnosis not present

## 2023-10-12 DIAGNOSIS — B20 Human immunodeficiency virus [HIV] disease: Secondary | ICD-10-CM | POA: Diagnosis not present

## 2023-10-12 DIAGNOSIS — Z23 Encounter for immunization: Secondary | ICD-10-CM

## 2023-10-12 DIAGNOSIS — Z79899 Other long term (current) drug therapy: Secondary | ICD-10-CM

## 2023-10-12 MED ORDER — BIKTARVY 50-200-25 MG PO TABS: 1.0000 | ORAL_TABLET | Freq: Every day | ORAL | 3 refills | Status: AC

## 2023-10-13 LAB — T-HELPER CELLS (CD4) COUNT (NOT AT ARMC)
CD4 % Helper T Cell: 42 % (ref 33–65)
CD4 T Cell Abs: 1126 /uL (ref 400–1790)

## 2023-10-14 LAB — COMPLETE METABOLIC PANEL WITHOUT GFR
AG Ratio: 1.5 (calc) (ref 1.0–2.5)
ALT: 47 U/L — ABNORMAL HIGH (ref 9–46)
AST: 30 U/L (ref 10–40)
Albumin: 5 g/dL (ref 3.6–5.1)
Alkaline phosphatase (APISO): 72 U/L (ref 36–130)
BUN: 17 mg/dL (ref 7–25)
CO2: 24 mmol/L (ref 20–32)
Calcium: 10.2 mg/dL (ref 8.6–10.3)
Chloride: 104 mmol/L (ref 98–110)
Creat: 1.17 mg/dL (ref 0.60–1.26)
Globulin: 3.3 g/dL (ref 1.9–3.7)
Glucose, Bld: 92 mg/dL (ref 65–99)
Potassium: 4.1 mmol/L (ref 3.5–5.3)
Sodium: 138 mmol/L (ref 135–146)
Total Bilirubin: 0.4 mg/dL (ref 0.2–1.2)
Total Protein: 8.3 g/dL — ABNORMAL HIGH (ref 6.1–8.1)

## 2023-10-14 LAB — LIPID PANEL
Cholesterol: 186 mg/dL (ref <200)
HDL: 36 mg/dL — ABNORMAL LOW (ref >=40)
Non-HDL Cholesterol (Calc): 150 mg/dL — ABNORMAL HIGH (ref <130)
Total CHOL/HDL Ratio: 5.2 (calc) — ABNORMAL HIGH (ref <5.0)
Triglycerides: 463 mg/dL — ABNORMAL HIGH (ref <150)

## 2023-10-14 LAB — CBC WITH DIFFERENTIAL/PLATELET
Absolute Lymphocytes: 3089 {cells}/uL (ref 850–3900)
Absolute Monocytes: 476 {cells}/uL (ref 200–950)
Basophils Absolute: 43 {cells}/uL (ref 0–200)
Basophils Relative: 0.6 %
Eosinophils Absolute: 78 {cells}/uL (ref 15–500)
Eosinophils Relative: 1.1 %
HCT: 46.7 % (ref 38.5–50.0)
Hemoglobin: 15.7 g/dL (ref 13.2–17.1)
MCH: 31.7 pg (ref 27.0–33.0)
MCHC: 33.6 g/dL (ref 32.0–36.0)
MCV: 94.3 fL (ref 80.0–100.0)
MPV: 10.3 fL (ref 7.5–12.5)
Monocytes Relative: 6.7 %
Neutro Abs: 3415 {cells}/uL (ref 1500–7800)
Neutrophils Relative %: 48.1 %
Platelets: 325 Thousand/uL (ref 140–400)
RBC: 4.95 Million/uL (ref 4.20–5.80)
RDW: 12.1 % (ref 11.0–15.0)
Total Lymphocyte: 43.5 %
WBC: 7.1 Thousand/uL (ref 3.8–10.8)

## 2023-10-14 LAB — URINE CYTOLOGY ANCILLARY ONLY
Chlamydia: NEGATIVE
Comment: NEGATIVE
Comment: NORMAL
Neisseria Gonorrhea: NEGATIVE

## 2023-10-14 LAB — HIV-1 RNA QUANT-NO REFLEX-BLD
HIV 1 RNA Quant: <20 {copies}/mL — AB
HIV-1 RNA Quant, Log: <1.3 {Log_copies}/mL — AB

## 2023-10-14 LAB — RPR: RPR Ser Ql: NONREACTIVE

## 2023-10-19 ENCOUNTER — Ambulatory Visit: Payer: Self-pay

## 2023-10-19 ENCOUNTER — Other Ambulatory Visit: Payer: Self-pay | Admitting: Infectious Disease

## 2023-10-19 DIAGNOSIS — R85610 Atypical squamous cells of undetermined significance on cytologic smear of anus (ASC-US): Secondary | ICD-10-CM

## 2023-10-19 LAB — CYTOLOGY - PAP
Comment: NEGATIVE
Diagnosis: UNDETERMINED — AB
High risk HPV: NEGATIVE

## 2024-08-21 ENCOUNTER — Ambulatory Visit: Admitting: Infectious Disease
